# Patient Record
Sex: Female | Born: 1985 | Race: Black or African American | Hispanic: No | Marital: Single | Smoking: Never smoker
Health system: Southern US, Community
[De-identification: ages and names within clinical notes are randomized; demographics above are authoritative.]

## PROBLEM LIST (undated history)

## (undated) DIAGNOSIS — M545 Low back pain, unspecified: Secondary | ICD-10-CM

## (undated) DIAGNOSIS — E559 Vitamin D deficiency, unspecified: Secondary | ICD-10-CM

## (undated) DIAGNOSIS — R5383 Other fatigue: Secondary | ICD-10-CM

## (undated) DIAGNOSIS — R600 Localized edema: Secondary | ICD-10-CM

## (undated) DIAGNOSIS — F419 Anxiety disorder, unspecified: Secondary | ICD-10-CM

## (undated) DIAGNOSIS — A749 Chlamydial infection, unspecified: Secondary | ICD-10-CM

## (undated) DIAGNOSIS — E739 Lactose intolerance, unspecified: Secondary | ICD-10-CM

## (undated) DIAGNOSIS — K5900 Constipation, unspecified: Secondary | ICD-10-CM

## (undated) DIAGNOSIS — M2240 Chondromalacia patellae, unspecified knee: Secondary | ICD-10-CM

## (undated) DIAGNOSIS — R0602 Shortness of breath: Secondary | ICD-10-CM

## (undated) HISTORY — DX: Other fatigue: R53.83

## (undated) HISTORY — DX: Constipation, unspecified: K59.00

## (undated) HISTORY — DX: Vitamin D deficiency, unspecified: E55.9

## (undated) HISTORY — DX: Lactose intolerance, unspecified: E73.9

## (undated) HISTORY — DX: Localized edema: R60.0

## (undated) HISTORY — DX: Chondromalacia patellae, unspecified knee: M22.40

## (undated) HISTORY — DX: Chlamydial infection, unspecified: A74.9

## (undated) HISTORY — DX: Shortness of breath: R06.02

## (undated) HISTORY — DX: Anxiety disorder, unspecified: F41.9

## (undated) HISTORY — DX: Low back pain, unspecified: M54.50

---

## 2003-09-27 ENCOUNTER — Emergency Department (HOSPITAL_COMMUNITY): Admission: EM | Admit: 2003-09-27 | Discharge: 2003-09-27 | Payer: Self-pay | Admitting: Emergency Medicine

## 2006-08-02 DIAGNOSIS — A749 Chlamydial infection, unspecified: Secondary | ICD-10-CM

## 2006-08-02 HISTORY — DX: Chlamydial infection, unspecified: A74.9

## 2011-09-28 ENCOUNTER — Ambulatory Visit
Admission: RE | Admit: 2011-09-28 | Discharge: 2011-09-28 | Disposition: A | Discharge status: Home / Self Care | Payer: BC Managed Care – PPO | Source: Ambulatory Visit | Attending: Family Medicine | Admitting: Family Medicine

## 2011-09-28 ENCOUNTER — Other Ambulatory Visit: Payer: Self-pay | Admitting: Family Medicine

## 2011-09-28 ENCOUNTER — Encounter (INDEPENDENT_AMBULATORY_CARE_PROVIDER_SITE_OTHER): Payer: Self-pay | Admitting: Surgery

## 2011-09-28 DIAGNOSIS — R1030 Lower abdominal pain, unspecified: Secondary | ICD-10-CM

## 2011-09-28 DIAGNOSIS — N209 Urinary calculus, unspecified: Secondary | ICD-10-CM

## 2011-09-28 MED ORDER — IOHEXOL 300 MG/ML  SOLN
30.0000 mL | Freq: Once | INTRAMUSCULAR | Status: AC | PRN
  Administered 2011-09-28: 30 mL via ORAL

## 2011-09-28 MED ORDER — IOHEXOL 300 MG/ML  SOLN
100.0000 mL | Freq: Once | INTRAMUSCULAR | Status: AC | PRN
  Administered 2011-09-28: 100 mL via INTRAVENOUS

## 2011-09-29 ENCOUNTER — Encounter (INDEPENDENT_AMBULATORY_CARE_PROVIDER_SITE_OTHER): Payer: Self-pay | Admitting: General Surgery

## 2011-09-29 ENCOUNTER — Ambulatory Visit (INDEPENDENT_AMBULATORY_CARE_PROVIDER_SITE_OTHER): Payer: BC Managed Care – PPO | Admitting: General Surgery

## 2011-09-29 DIAGNOSIS — R1031 Right lower quadrant pain: Secondary | ICD-10-CM

## 2011-09-29 NOTE — Progress Notes (Signed)
Patient ID: Regina Bowers, female   DOB: 01-30-1986, 26 y.o.   MRN: [id number]  No chief complaint on file.   Regina Bowers is a 26 y.o. female.  This patient is referred by Dr. Paulino Rily for evaluation of right lower quadrant abdominal pain. She states that the pain began about 3 days ago in her right lower quadrant and side after working out. She had some crampy pain and thought that this was discussed pain. She took a laxative on Sunday for relief but her symptoms were not relieved. She did have a bowel movement today which was normal. On Monday her symptoms were worse after eating chili and after moving around but today she denies any pain. She has no other associated symptoms such as dysuria or hematuria she has no anorexia, fevers, chills, nausea, or vomiting. Her last menstrual period was February 14th. She had a CT scan performed yesterday which demonstrated an abnormal appendix with some thickening of the appendix and some dilation but there were no periappendiceal inflammatory changes her white count was normal at 5.6 with a 73% neutrophil count otherwise her labs were unremarkable UA was normal. Pregnancy test was negative. Regina  Past Medical History  Diagnosis Date  . Chlamydia 2008  . Abdominal pain     History reviewed. No pertinent past surgical history.  History reviewed. No pertinent family history.  Social History History  Substance Use Topics  . Smoking status: Never Smoker   . Smokeless tobacco: Not on file  . Alcohol Use: Yes     1x month    Allergies  Allergen Reactions  . Aspirin Hives  . Ibuprofen Hives    Current Outpatient Prescriptions  Medication Sig Dispense Refill  . Echinacea 400 MG CAPS Take 400 mg by mouth daily.      . folic acid (FOLVITE) 1 MG tablet Take 1 mg by mouth daily.      . nitroGLYCERIN (NITROGLYN) 2 % ointment Place 0.5 inches onto the skin every 6 (six) hours.      . Norgestimate-Ethinyl Estradiol Triphasic (TRI-SPRINTEC)  0.18/0.215/0.25 MG-35 MCG tablet Take 1 tablet by mouth daily.      . pediatric multivitamin-fluoride (POLY-VI-FLOR) 0.25 MG chewable tablet Chew 1 tablet by mouth daily.      Marland Kitchen sulfamethoxazole-trimethoprim (BACTRIM,SEPTRA) 400-80 MG per tablet Take 1 tablet by mouth 2 (two) times daily.       No current facility-administered medications for this visit.   Facility-Administered Medications Ordered in Other Visits  Medication Dose Route Frequency Provider Last Rate Last Dose  . iohexol (OMNIPAQUE) 300 MG/ML solution 30 mL  30 mL Oral Once PRN Medication Radiologist, MD   30 mL at 09/28/11 1206    Review of Systems Review of Systems All other review of systems negative or noncontributory except as stated in the Regina  Last menstrual period 09/16/2011.  Physical Exam Physical Exam Physical Exam  Nursing note and vitals reviewed. Constitutional: She is oriented to person, place, and time. She appears well-developed and well-nourished. No distress.  HENT:  Head: Normocephalic and atraumatic.  Mouth/Throat: No oropharyngeal exudate.  Eyes: Conjunctivae and EOM are normal. Pupils are equal, round, and reactive to light. Right eye exhibits no discharge. Left eye exhibits no discharge. No scleral icterus.  Neck: Normal range of motion. Neck supple. No tracheal deviation present.  Cardiovascular: Normal rate, regular rhythm, normal heart sounds and intact distal pulses.   Pulmonary/Chest: Effort normal and breath sounds normal. No stridor. No respiratory distress.  She has no wheezes.  Abdominal: Soft. Bowel sounds are normal. She exhibits no distension and no mass. There is mild RLQ tenderness. There is no rebound and no guarding.  Musculoskeletal: Normal range of motion. She exhibits no edema and no tenderness.  Neurological: She is alert and oriented to person, place, and time.  Skin: Skin is warm and dry. No rash noted. She is not diaphoretic. No erythema. No pallor.  Psychiatric: She has a  normal mood and affect. Her behavior is normal. Judgment and thought content normal.      Data Reviewed CT, labs  Assessment    Right lower quadrant abdominal pain This does not appear to be acute appendicitis given her history. She is actually pain-free today and I suspect that her symptoms would only be progressive if this was indeed acute appendicitis. However, given her abnormal appendix the question is what to do about this for a long-term standpoint. I offered to see her back in one month and repeat the CT scan at that time or diagnostic laparoscopy and appendectomy to remove the abnormal appendix. This may be acute appendicitis and I explained that I cannot completely rule this out without surgical intervention but given her symptoms again, I think that this is less likely. This may be appendiceal thickening from prior ruptured ovarian cyst or other intra-abdominal cause but regardless I think we should at least at the minimum repeat the imaging within a few weeks.I discussed the procedure involved and the risks of the procedure including infection, bleeding, pain, scarring, bowel injury, staple line leaks, and abscess.    Plan    She has discussed this with her mother and since she is currently pain-free she would like to hold off on any surgery. We will plan for repeat CT scan in 4 weeks and reevaluate at that time she will likely need appendectomy on elective basis. She understands that if her symptoms continue or if she has any fevers or chills or increasing pain that she should present to the emergency room for immediate appendectomy.       Lodema Pilot DAVID 09/29/2011, 11:42 AM

## 2011-10-25 ENCOUNTER — Ambulatory Visit
Admission: RE | Admit: 2011-10-25 | Discharge: 2011-10-25 | Disposition: A | Discharge status: Home / Self Care | Payer: BC Managed Care – PPO | Source: Ambulatory Visit | Attending: General Surgery | Admitting: General Surgery

## 2011-10-25 DIAGNOSIS — R1031 Right lower quadrant pain: Secondary | ICD-10-CM

## 2011-10-25 MED ORDER — IOHEXOL 300 MG/ML  SOLN
100.0000 mL | Freq: Once | INTRAMUSCULAR | Status: AC | PRN
  Administered 2011-10-25: 100 mL via INTRAVENOUS

## 2012-05-01 ENCOUNTER — Telehealth: Payer: Self-pay | Admitting: Obstetrics and Gynecology

## 2012-05-01 MED ORDER — NORGESTIM-ETH ESTRAD TRIPHASIC 0.18/0.215/0.25 MG-35 MCG PO TABS: 1.0000 | ORAL_TABLET | Freq: Every day | ORAL | Status: DC

## 2012-05-01 NOTE — Telephone Encounter (Signed)
Spoke with pt informed rx sent to pharm pt voice understanding 

## 2012-05-01 NOTE — Telephone Encounter (Signed)
VM from pt. REquesting RF Trisrpintec. [place] 11/14.   [facility], [address].  Pt [phone]

## 2012-06-14 ENCOUNTER — Ambulatory Visit (INDEPENDENT_AMBULATORY_CARE_PROVIDER_SITE_OTHER): Payer: BC Managed Care – PPO | Admitting: Obstetrics and Gynecology

## 2012-06-14 ENCOUNTER — Encounter: Payer: Self-pay | Admitting: Obstetrics and Gynecology

## 2012-06-14 VITALS — BP 122/80 | Ht 61.0 in | Wt 203.0 lb

## 2012-06-14 DIAGNOSIS — Z202 Contact with and (suspected) exposure to infections with a predominantly sexual mode of transmission: Secondary | ICD-10-CM

## 2012-06-14 DIAGNOSIS — Z2089 Contact with and (suspected) exposure to other communicable diseases: Secondary | ICD-10-CM

## 2012-06-14 DIAGNOSIS — Z124 Encounter for screening for malignant neoplasm of cervix: Secondary | ICD-10-CM

## 2012-06-14 NOTE — Patient Instructions (Signed)

## 2012-06-14 NOTE — Progress Notes (Signed)
Last Pap: 2012 WNL: Yes Regular Periods:yes Contraception: tri-sprintec  Monthly Breast exam:yes Tetanus<36yrs:yes Nl.Bladder Function:yes Daily BMs:yes Healthy Diet:yes Calcium:yes Mammogram:no Date of Mammogram: n/a Exercise:yes Have often Exercise: occ Seatbelt: yes Abuse at home: no Stressful work:no Sigmoid-colonoscopy: n/a Bone Density: No PCP: Dr.Wolters Change in PMH: no change Change in FMH:no change BP 122/80  Ht 5\' 1"  (1.549 m)  Wt 203 lb (92.08 kg)  BMI 38.36 kg/m2  LMP 05/26/2012 Pt with complaints:no Physical Examination: General appearance - alert, well appearing, and in no distress Mental status - normal mood, behavior, speech, dress, motor activity, and thought processes Neck - supple, no significant adenopathy,  thyroid exam: thyroid is normal in size without nodules or tenderness Chest - clear to auscultation, no wheezes, rales or rhonchi, symmetric air entry Heart - normal rate and regular rhythm Abdomen - soft, nontender, nondistended, no masses or organomegaly Breasts - breasts appear normal, no suspicious masses, no skin or nipple changes or axillary nodes Pelvic - normal external genitalia, vulva, vagina, cervix, uterus and adnexa Rectal - rectal exam not indicated Back exam - full range of motion, no tenderness, palpable spasm or pain on motion Neurological - alert, oriented, normal speech, no focal findings or movement disorder noted Musculoskeletal - no joint tenderness, deformity or swelling Extremities - no edema, redness or tenderness in the calves or thighs Skin - normal coloration and turgor, no rashes, no suspicious skin lesions noted Routine exam Pap sent yes Mammogram due no sprintec used for contraception RT 1 yr

## 2012-06-15 ENCOUNTER — Telehealth: Payer: Self-pay | Admitting: Obstetrics and Gynecology

## 2012-06-15 NOTE — Telephone Encounter (Signed)
nd pt 

## 2012-06-15 NOTE — Telephone Encounter (Signed)
Spoke with pt Regina Bowers msg pt states she having break threw bleeding on tri sprentec for the past 5 months pt may want to switch bc advised pt will consult with ND and call her back pt voice understanding

## 2012-06-16 LAB — PAP IG, CT-NG, RFX HPV ASCU: GC Probe Amp: NEGATIVE

## 2012-06-16 NOTE — Telephone Encounter (Signed)
Has pt missed any pills?  If not she can try ortho cyclen or ovcon 35 for the next three months

## 2012-06-19 ENCOUNTER — Telehealth: Payer: Self-pay | Admitting: Obstetrics and Gynecology

## 2012-06-19 LAB — HUMAN PAPILLOMAVIRUS, HIGH RISK: HPV DNA High Risk: DETECTED — AB

## 2012-06-19 MED ORDER — NORETHINDRONE-ETH ESTRADIOL 1-35 MG-MCG PO TABS: 1.0000 | ORAL_TABLET | Freq: Every day | ORAL | Status: DC

## 2012-06-19 NOTE — Telephone Encounter (Signed)
Spoke with pt rgd prev conversation informed pt per ND will switch bc to see if it helps with break thru bleeding advised rx sent to pharm pt voice understanding

## 2012-06-27 ENCOUNTER — Telehealth: Payer: Self-pay

## 2012-06-27 NOTE — Telephone Encounter (Signed)
Spoke with pt rgd labs informed pap showed abnl cells need colpo pt has appt 07/14/12 at 2:45 with ND pt voice understanding

## 2012-06-27 NOTE — Telephone Encounter (Signed)
Message copied by Rolla Plate on Tue Jun 27, 2012 11:18 AM ------      Message from: Jaymes Graff      Created: Sun Jun 25, 2012 10:19 PM       Please schedule pt for colposcopy.

## 2012-07-04 ENCOUNTER — Telehealth: Payer: Self-pay | Admitting: Obstetrics and Gynecology

## 2012-07-06 ENCOUNTER — Telehealth: Payer: Self-pay | Admitting: Obstetrics and Gynecology

## 2012-07-10 NOTE — Telephone Encounter (Signed)
See msg, thanks

## 2012-07-11 ENCOUNTER — Telehealth: Payer: Self-pay | Admitting: Obstetrics and Gynecology

## 2012-07-11 NOTE — Telephone Encounter (Signed)
TC TO PT TO RESCHEDULE COLPO. PT DECIDED TO KEEP THE APPT SHE HAS WHICH IS 07-14-12 AT 2:45 PM.

## 2012-07-14 ENCOUNTER — Encounter: Payer: BC Managed Care – PPO | Admitting: Obstetrics and Gynecology

## 2012-07-25 NOTE — Telephone Encounter (Signed)
CALL PT TO RESCHEDULE PT COLPO. SCHEDULE PT COLPO WITH ND TO 08-01-12. PT VOICED UNDERSTANDING.

## 2012-08-01 ENCOUNTER — Encounter: Payer: Self-pay | Admitting: Obstetrics and Gynecology

## 2012-08-01 ENCOUNTER — Ambulatory Visit (INDEPENDENT_AMBULATORY_CARE_PROVIDER_SITE_OTHER): Payer: BC Managed Care – PPO | Admitting: Obstetrics and Gynecology

## 2012-08-01 VITALS — BP 100/68 | Wt 211.0 lb

## 2012-08-01 DIAGNOSIS — R6889 Other general symptoms and signs: Secondary | ICD-10-CM

## 2012-08-01 NOTE — Addendum Note (Signed)
Addended by: Rolla Plate on: 08/01/2012 04:27 PM   Modules accepted: Orders

## 2012-08-01 NOTE — Patient Instructions (Signed)

## 2012-08-01 NOTE — Progress Notes (Signed)
Previous Pap Smear: 06/04/12 ASCUS pos HRHPV Previous Colposcopy: 07/21/09 LSIL, MILD DYSPLASIA, HPV INFECTION, CIN1 7 o'clock  Referred From: n/a LMP: 07/17/12 Contraception: pill- Necon G,P: 0, 0 BP 100/68  Wt 211 lb (95.709 kg)  LMP 07/17/2012 AW changes and mosacism at 2 o clock.   bx st 2 with ECC colpo adequate RT 6 monts for pap

## 2012-08-08 ENCOUNTER — Telehealth: Payer: Self-pay

## 2012-08-08 NOTE — Telephone Encounter (Signed)
Spoke with pt rgd schd appt to discuss colpo results pt has appt 08/12/11 at 3:15 with nd pt voice understanding

## 2012-08-08 NOTE — Telephone Encounter (Signed)
Message copied by Rolla Plate on Tue Aug 08, 2012 11:33 AM ------      Message from: Jaymes Graff      Created: Mon Aug 07, 2012  9:24 PM       Please have pt come in to discuss results

## 2012-08-11 ENCOUNTER — Encounter: Payer: Self-pay | Admitting: Obstetrics and Gynecology

## 2012-08-11 ENCOUNTER — Ambulatory Visit (INDEPENDENT_AMBULATORY_CARE_PROVIDER_SITE_OTHER): Payer: BC Managed Care – PPO | Admitting: Obstetrics and Gynecology

## 2012-08-11 VITALS — BP 110/64 | Wt 218.0 lb

## 2012-08-11 DIAGNOSIS — N871 Moderate cervical dysplasia: Secondary | ICD-10-CM

## 2012-08-11 NOTE — Patient Instructions (Signed)
Cervical Dysplasia Cervical dysplasia is a condition in which a woman has abnormal changes in the cells of her cervix. The cervix is the opening to the uterus (womb) between the vagina and the uterus. These changes are called cervical dysplasia and may be the first signs of cervical cancer. These cells can be taken from the cervix during a Pap test and then looked at under a microscope. With early detection, treatment, and close follow-up care, nearly all cervical dysplasia can be cured. If untreated, the mild to moderate stages of dysplasia often grow more severe.  RISK FACTORS  The following increase the risk for cervical dysplasia.  Having had a sexually transmitted disease, including:  Chlamydia.  Human papilloma virus (HPV).  Becoming sexually active before age 18.  Having had more than 1 sexual partner.  Not using protection, such as condoms, during sexual intercourse, especially with new sexual partners.  Having had cancer of the vagina or vulva.  Having a sexual partner whose previous partner had cancer of the cervix or cervical dysplasia.  Having a sexual partner who has or has had cancer of the penis.  Having a weakened immune system (HIV, organ transplant).  Being the daughter of a woman who took DES (diethylstilbestrol) during pregnancy.  A history of cervical cancer in a woman's sister or mother.  Smoking.  Having had an abnormal Pap test in the past. SYMPTOMS  There are usually no symptoms. If there are symptoms, they may be vague such as:  Abnormal vaginal discharge.  Bleeding between periods or following intercourse.  Bleeding during menopause.  Pain on intercourse (dyspareunia). DIAGNOSIS   The Pap test is the best way of detecting abnormalities of the cervix.  Biopsy (removing a piece of tissue to look at under the microscope) of the cervix when the Pap test is abnormal or when the Pap test is normal, but the cervix looks abnormal. TREATMENT    Catching and treating the changes early with Pap tests can prevent cervical cancer.  Cryotherapy freezes the abnormal cells with a steel tip instrument.  A laser can be used to remove the abnormal cells.  Loop electrocautery excision procedure (LEEP). This procedure uses a heated electrical loop to remove a cone-like portion of the cervix, including the cervical canal.  For more serious cases of cervical dysplasia, the abnormal tissue may be removed surgically by:  A cone biopsy (by cold knife, laser or LEEP). A procedure in which a portion of the center of the cervix with the cervical canal is removed.  The uterus and cervix are removed (hysterectomy). Your caregiver will advise you regarding the need and timing of Pap tests in your follow-up. Women who have been treated for dysplasia should be closely followed with pelvic exams and Pap tests. During the first year following treatment of cervical dysplasia, Pap tests should be done every 3 to 4 months. In the second year, the schedule is every 6 months, or as recommended by your caregiver. See your caregiver for new or worsening problems. HOME CARE INSTRUCTIONS   Follow the instructions and recommendations of your caregiver regarding medicines and follow-up appointments.  Only take over-the-counter or prescription medicines for pain or discomfort as directed by your caregiver.  Cramping and pelvic discomfort may follow cryotherapy. It is not abnormal to have watery discharge for several weeks after.  Laser, cone surgery, cryotherapy or LEEP can cause a bad smelling vaginal discharge. It may also cause vaginal bleeding for a couple weeks following the procedure. The   discharge may be black from the paste used to control bleeding from the cone site. This is normal.  Do not use tampons, have sexual intercourse or douche until your caregiver says it is okay. SEEK MEDICAL CARE IF:   You develop genital warts.  You need a prescription for  pain medicine following your treatment. SEEK IMMEDIATE MEDICAL CARE IF:   Your bleeding is heavier than a normal menstrual period.  You develop bright red bleeding, especially if you have blood clots.  You have a fever.  You have increasing cramps or pain not relieved with medicine.  You are lightheaded, unusually weak, or have fainting spells.  You have abnormal vaginal discharge.  You develop abdominal pain. PREVENTION   The surest way to prevent cervical dysplasia is to abstain from sexual intercourse.  Practice safe sex, use condoms and have only one sex partner who does not have other sex partners.  A Pap test is done to screen for cervical cancer.  The first Pap test should be done at age 21.  Between ages 21 and 29, Pap tests are repeated every 2 years.  Beginning at age 30, you are advised to have a Pap test every 3 years as long as your past 3 Pap tests have been normal.  Some women have medical problems that increase the chance of getting cervical cancer. Talk to your caregiver about these problems. It is especially important to talk to your caregiver if a new problem develops soon after your last Pap test. In these cases, your caregiver may recommend more frequent screening and Pap tests.  The above recommendations are the same for women who have or have not gotten the vaccine for HPV (Human Papillomavirus).  If you had a hysterectomy for a problem that was not a cancer or a condition that could lead to cancer, then you no longer need Pap tests. However, even if you no longer need a Pap test, a regular exam is a good idea to make sure no other problems are starting.   If you are between ages 65 and 70, and you have had normal Pap tests going back 10 years, you no longer need Pap tests. However, even if you no longer need a Pap test, a regular exam is a good idea to make sure no other problems are starting.   If you have had past treatment for cervical cancer or a  condition that could lead to cancer, you need Pap tests and screening for cancer for at least 20 years after your treatment.  If Pap tests have been discontinued, risk factors (such as a new sexual partner) need to be re-assessed to determine if screening should be resumed.  Some women may need screenings more often if they are at high risk for cervical cancer.  Your caregiver may do additional tests including:  Colposcopy. A procedure in which a special microscope magnifies the cells and allows the provider to closely examine the cervix, vagina, and vulva.  Biopsy. A small tissue sample is taken from the cervix, vagina or vulva. This is generally done in your caregivers office.  A cone biopsy (cold knife or laser). A large tissue sample is taken from the cervix. This procedure is usually done in an operating room under a general anesthetic. The cone often removes all abnormal tissue and so may also complete the treatment.  LEEP, also removing a circular portion of the cervix and is done in a doctors office under a local anesthetic.  Now   there is a vaccine, Gardasil, that was developed to prevent the HPV'S that can cause cancer of the cervix and genital warts. It is recommended for females ages 9 to 26. It should not be given to pregnant women until more is known about its effects on the fetus. Not all cancers of the cervix are caused by the HPV. Routine gynecology exams and Pap tests should continue as recommended by your caregiver. Document Released: 07/19/2005 Document Revised: 10/11/2011 Document Reviewed: 07/10/2008 Exit[employer] Patient Information 2013 [employer].  

## 2012-08-11 NOTE — Progress Notes (Signed)
Discuss colpo results.  Pt without c/o  Report Comments: FINAL DIAGNOSIS: A. Cervix- Biopsy, 2 o'clock:  High grade squamous intraepithelial lesion (HSIL), moderate dysplasia, CIN II.  See comment.  B. Endocervix - Curettage:  Fragments of benign endocervical mucosa.  Negative for atypia.  Pathophysiology discussed with the pt Pt told the difference between LEEP, CRYO, and OBS She will call in a week with her decision

## 2012-09-07 ENCOUNTER — Telehealth: Payer: Self-pay | Admitting: Obstetrics and Gynecology

## 2012-09-07 NOTE — Telephone Encounter (Signed)
Tc to pt rgd msg. Pt states she forgot her question and will call back when she remembers.

## 2012-10-07 IMAGING — CT CT ABD-PELV W/ CM
2 of 4 series · 17 of 46 positions shown, 19 images · IV contrast (agent unspecified)
Comparison: 09/28/2011.

CLINICAL DATA: Follow-up appendiceal inflammatory process.

CT ABDOMEN AND PELVIS WITH CONTRAST
TECHNIQUE: Multidetector CT imaging of the abdomen and pelvis was
performed following the standard protocol during bolus
administration of intravenous contrast.
Contrast:  100 ml Mmnipaque-EJJ.

[Series 2: abd/pelvis with · axial · 0.70mm/px · z∈[-344,+6]mm · 14 of 77 slices shown, 16 images]
[im 4/77  soft-tissue]
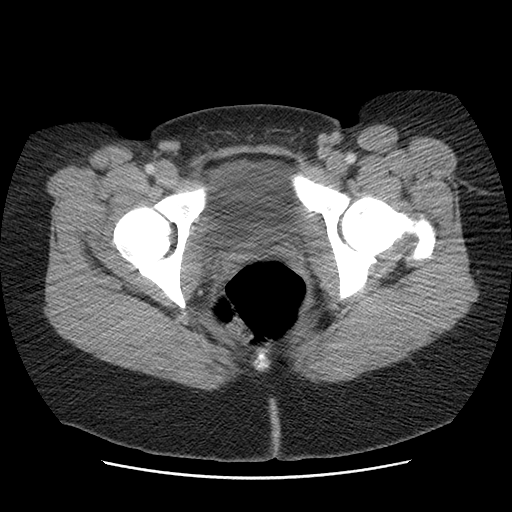
[im 4/77  bone]
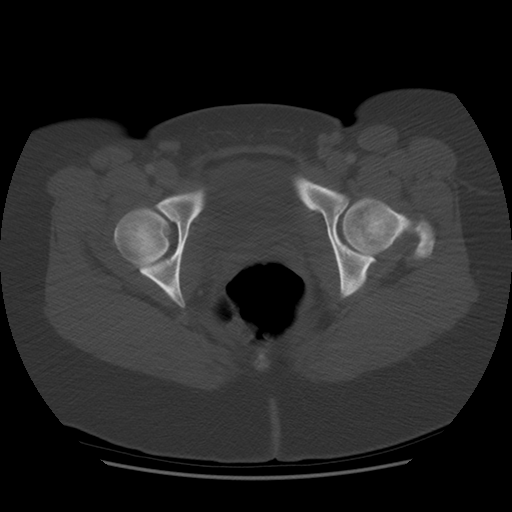
[im 10/77  soft-tissue]
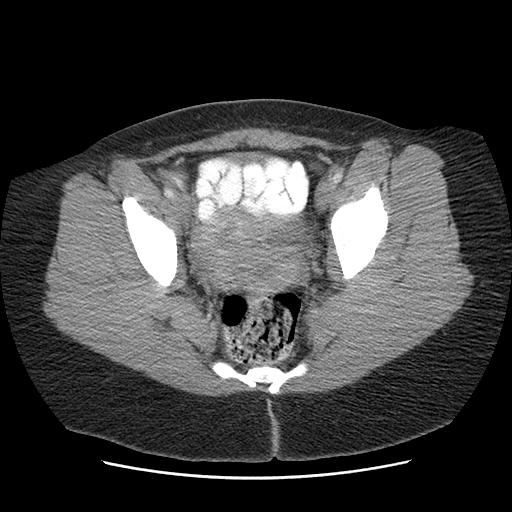
[im 14/77  soft-tissue]
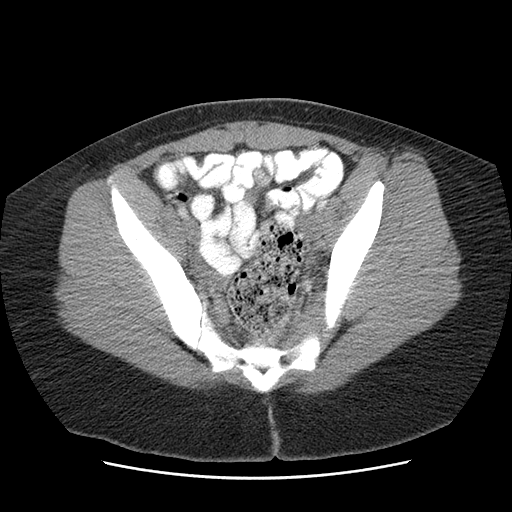
[im 20/77  soft-tissue]
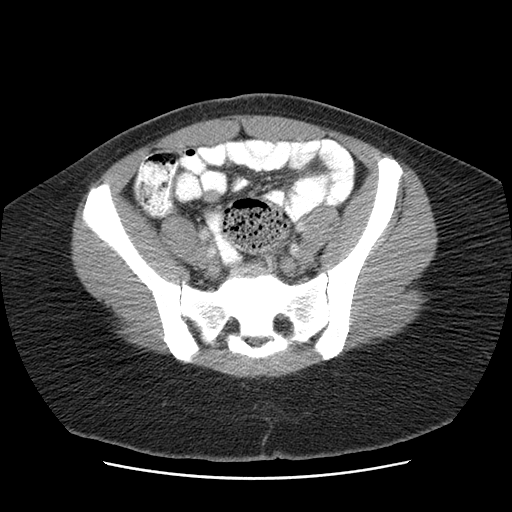
[im 27/77  soft-tissue]
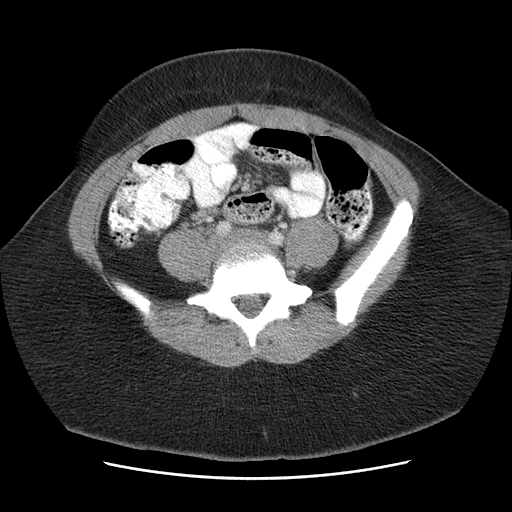
[im 30/77  soft-tissue]
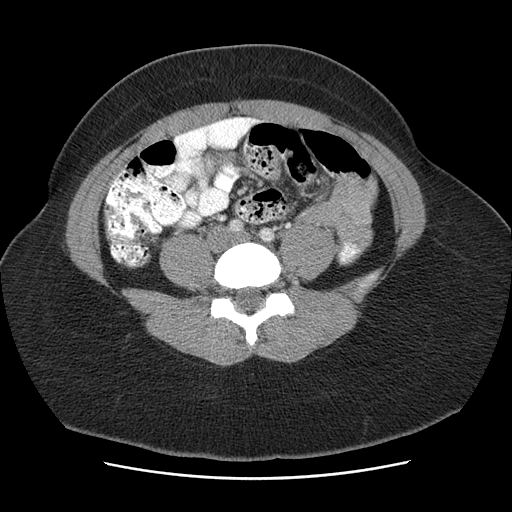
[im 37/77  soft-tissue]
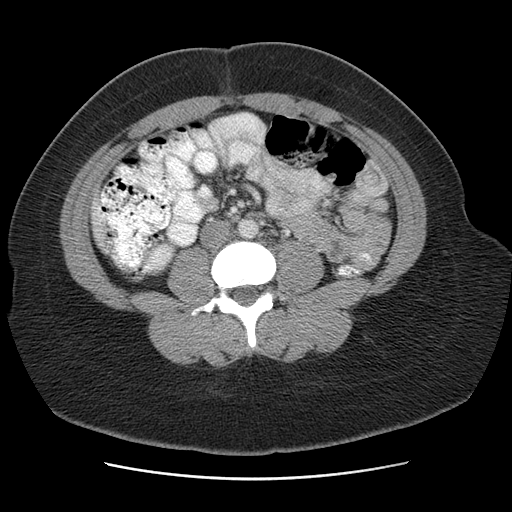
[im 40/77  soft-tissue]
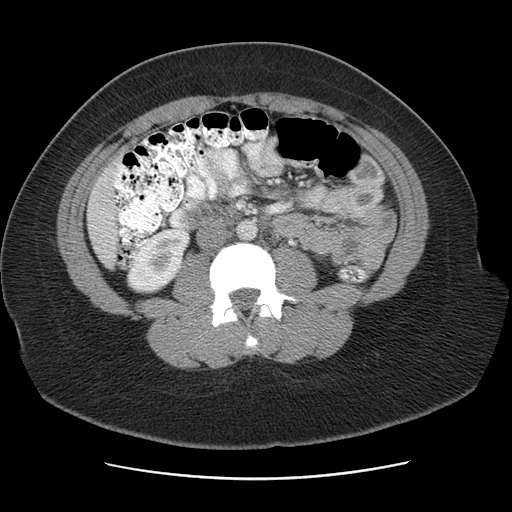
[im 47/77  soft-tissue]
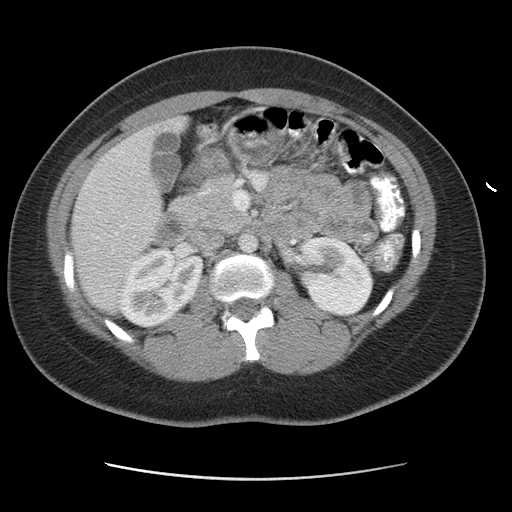
[im 47/77  bone]
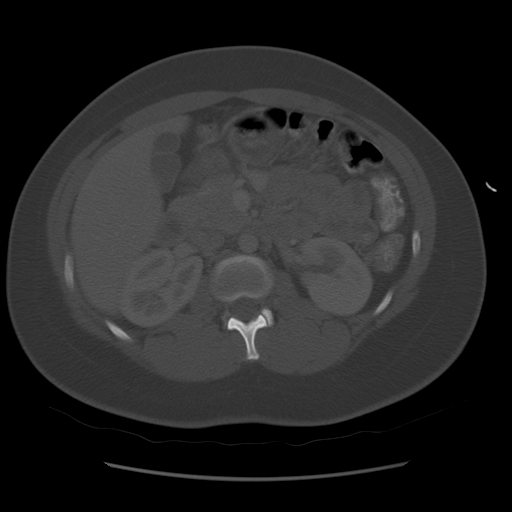
[im 50/77  soft-tissue]
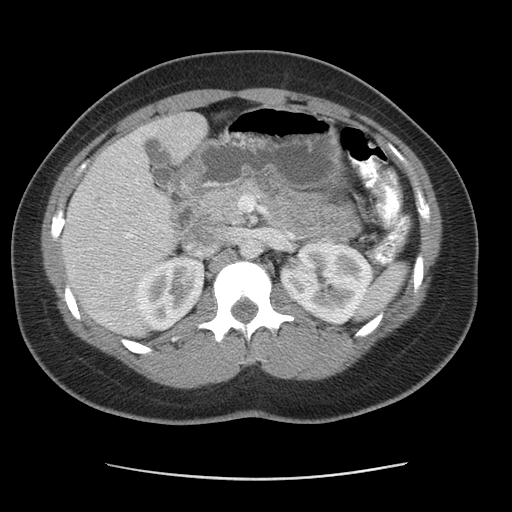
[im 57/77  soft-tissue]
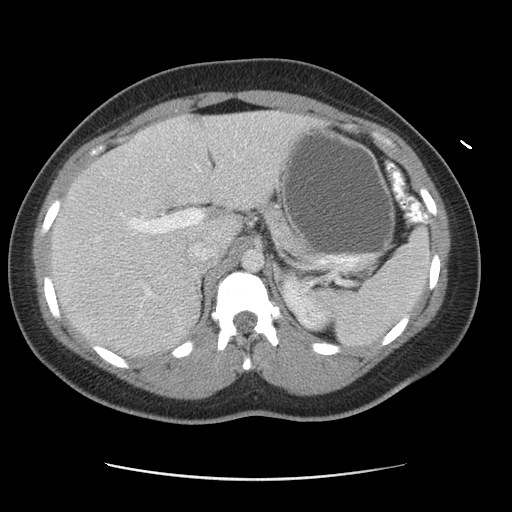
[im 63/77  soft-tissue]
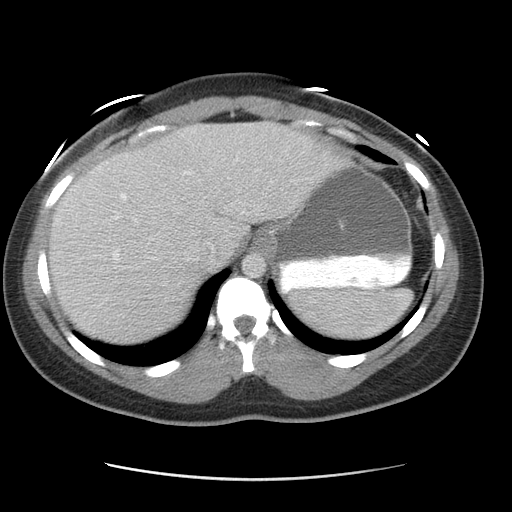
[im 67/77  soft-tissue]
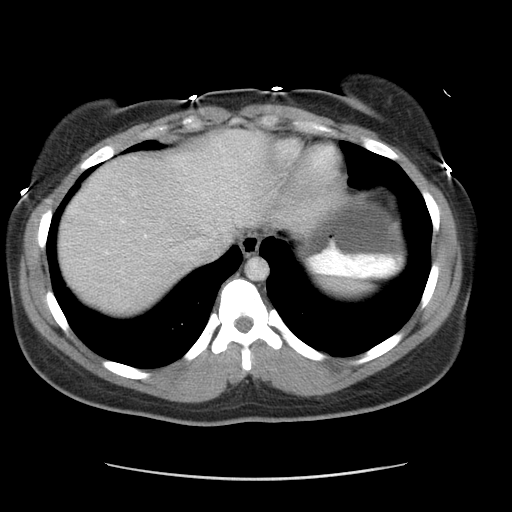
[im 73/77  soft-tissue]
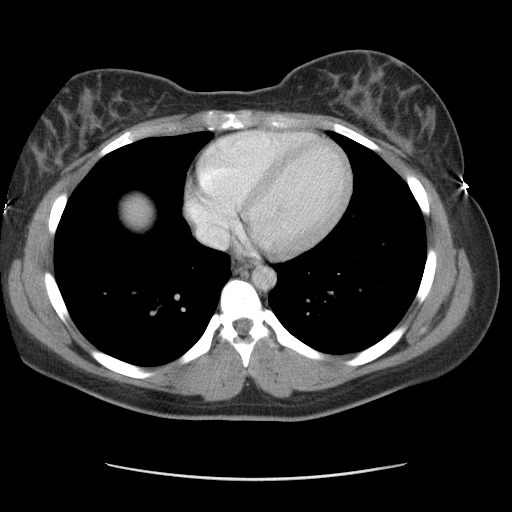

[Series 400: cor · coronal · 0.91mm/px · 3 of 125 slices shown]
[im 42/125  soft-tissue]
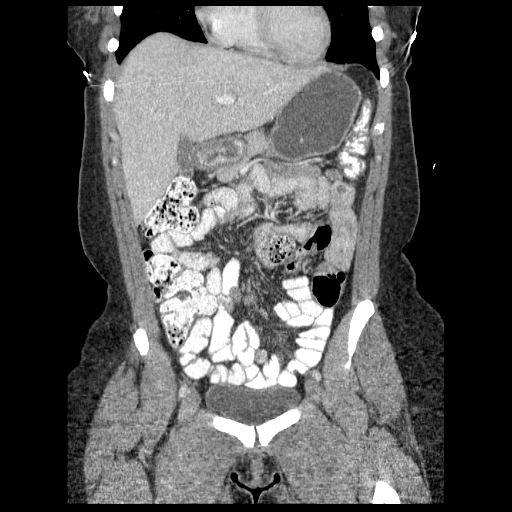
[im 56/125  soft-tissue]
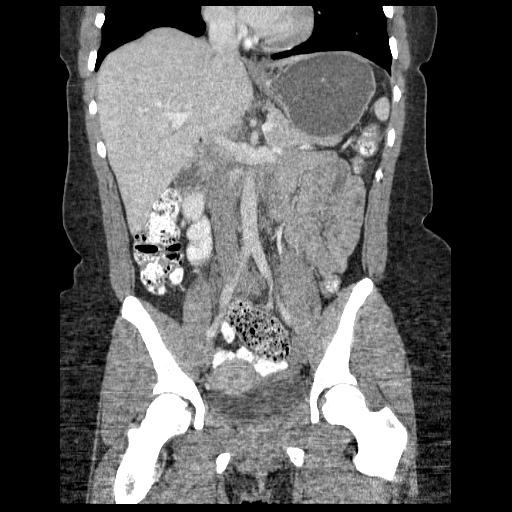
[im 69/125  soft-tissue]
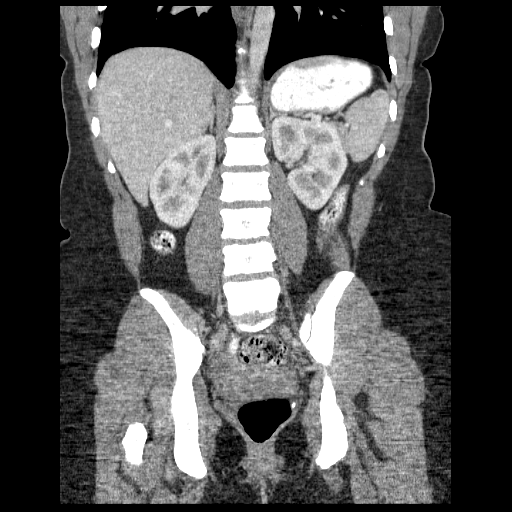

[17 of 46 positions shown; findings below may reference images not displayed]

FINDINGS: Lung bases show no acute findings.  Heart size normal.
No pericardial or pleural effusion.

Liver, gallbladder, adrenal glands, kidneys, spleen, pancreas,
stomach and bowel are unremarkable.  Appendix is air-filled and
normal in appearance (coronal image 50).  No inflammatory changes
in the right lower quadrant.  No abnormal fluid collections.  No
free fluid.  Inguinal lymph nodes are likely reactive.  Uterus and
ovaries are visualized.  No pathologically enlarged lymph nodes.
No worrisome lytic or sclerotic lesions.
IMPRESSION: Appendix is unremarkable.  Interval resolution of previously
suspected inflammatory findings discussed on 09/28/2011.

## 2012-10-11 ENCOUNTER — Telehealth: Payer: Self-pay | Admitting: Obstetrics and Gynecology

## 2012-10-11 NOTE — Telephone Encounter (Signed)
TC call regarding pt call. Per last note 08/11/2012 pt needed to call back w/decision for treatment Of abnormal pap/colpo.  Pt has decided she wants Cryosurgery.  Pt made aware I will make Dr.Dillard /NG,CMA aware.  Pt voiced her understanding.  [facility] CMA

## 2021-08-13 HISTORY — PX: DENTAL SURGERY: SHX609

## 2021-09-10 DIAGNOSIS — Z0289 Encounter for other administrative examinations: Secondary | ICD-10-CM

## 2021-09-22 ENCOUNTER — Encounter (INDEPENDENT_AMBULATORY_CARE_PROVIDER_SITE_OTHER): Payer: Self-pay | Admitting: Bariatrics

## 2021-09-22 ENCOUNTER — Other Ambulatory Visit: Payer: Self-pay

## 2021-09-22 ENCOUNTER — Ambulatory Visit (INDEPENDENT_AMBULATORY_CARE_PROVIDER_SITE_OTHER): Payer: BC Managed Care – PPO | Admitting: Bariatrics

## 2021-09-22 VITALS — BP 106/72 | HR 81 | Temp 98.2°F | Ht 63.0 in | Wt 264.0 lb

## 2021-09-22 DIAGNOSIS — E559 Vitamin D deficiency, unspecified: Secondary | ICD-10-CM | POA: Diagnosis not present

## 2021-09-22 DIAGNOSIS — K5901 Slow transit constipation: Secondary | ICD-10-CM

## 2021-09-22 DIAGNOSIS — Z1331 Encounter for screening for depression: Secondary | ICD-10-CM

## 2021-09-22 DIAGNOSIS — E669 Obesity, unspecified: Secondary | ICD-10-CM

## 2021-09-22 DIAGNOSIS — R5383 Other fatigue: Secondary | ICD-10-CM

## 2021-09-22 DIAGNOSIS — E739 Lactose intolerance, unspecified: Secondary | ICD-10-CM | POA: Diagnosis not present

## 2021-09-22 DIAGNOSIS — R0602 Shortness of breath: Secondary | ICD-10-CM | POA: Diagnosis not present

## 2021-09-22 DIAGNOSIS — Z6841 Body Mass Index (BMI) 40.0 and over, adult: Secondary | ICD-10-CM

## 2021-09-22 DIAGNOSIS — M545 Low back pain, unspecified: Secondary | ICD-10-CM

## 2021-09-22 DIAGNOSIS — R7309 Other abnormal glucose: Secondary | ICD-10-CM

## 2021-09-22 NOTE — Progress Notes (Signed)
Chief Complaint:   OBESITY Regina Bowers (MR# [id number]) is a 36 y.o. female who presents for evaluation and treatment of obesity and related comorbidities. Current BMI is Body mass index is 46.77 kg/m. Regina Bowers has been struggling with her weight for many years and has been unsuccessful in either losing weight, maintaining weight loss, or reaching her healthy weight goal.  Regina Bowers states that she likes to cook, but she notes this as an obstacle. She travels a lot and she needs to know how to best eat out.  Regina Bowers is currently in the action stage of change and ready to dedicate time achieving and maintaining a healthier weight. Regina Bowers is interested in becoming our patient and working on intensive lifestyle modifications including (but not limited to) diet and exercise for weight loss.  Regina Bowers's habits were reviewed today and are as follows: her desired weight loss is 64-79 lbs, she has been heavy most of her life, she started gaining weight in 2022, her heaviest weight ever was 265 pounds, she has significant food cravings issues, she skips meals frequently, she is frequently drinking liquids with calories, she frequently makes poor food choices, she frequently eats larger portions than normal, and she struggles with emotional eating.  Depression Screen Regina Bowers's Food and Mood (modified PHQ-9) score was 7.  Depression screen [facility] 2/9 09/22/2021  Decreased Interest 2  Down, Depressed, Hopeless 2  PHQ - 2 Score 4  Altered sleeping 0  Tired, decreased energy 0  Change in appetite 2  Feeling bad or failure about yourself  1  Trouble concentrating 0  Moving slowly or fidgety/restless 0  Suicidal thoughts 0  PHQ-9 Score 7  Difficult doing work/chores Not difficult at all   Subjective:   1. Other fatigue Regina Bowers admits to daytime somnolence and denies waking up still tired. Patient has a history of symptoms of daytime fatigue. Regina Bowers generally gets 5 or 6 hours of sleep per night, and  states that she has generally restful sleep. Snoring is not present. Apneic episodes are not present. Epworth Sleepiness Score is 4.   2. Shortness of breath on exertion Regina Bowers notes increasing shortness of breath with exercising and seems to be worsening over time with weight gain. She notes getting out of breath sooner with activity than she used to. This has not gotten worse recently. Regina Bowers denies shortness of breath at rest or orthopnea.  3. Bilateral low back pain, unspecified chronicity, unspecified whether sciatica present Regina Bowers notes her back pain is worse with weight gain.  4. Lactose intolerance Regina Bowers has ha diagnosis for about 15 years.  5. Vitamin D deficiency Regina Bowers is taking prenatal vitamins.  6. Slow transit constipation Regina Bowers is not eating enough fiber.  7. Elevated glucose Regina Bowers has no family history of diabetes mellitus.  Assessment/Plan:   1. Other fatigue Regina Bowers does feel that her weight is causing her energy to be lower than it should be. Fatigue may be related to obesity, depression or many other causes. Labs will be ordered, and in the meanwhile, Regina Bowers will focus on self care including making healthy food choices, increasing physical activity and focusing on stress reduction.  - EKG 12-Lead - TSH+T4F+T3Free - Lipid Panel With LDL/HDL Ratio - Comprehensive metabolic panel  2. Shortness of breath on exertion Regina Bowers does feel that she gets out of breath more easily that she used to when she exercises. Regina Bowers's shortness of breath appears to be obesity related and exercise induced. She has agreed to work on  weight loss and gradually increase exercise to treat her exercise induced shortness of breath. Will continue to monitor closely.  3. Bilateral low back pain, unspecified chronicity, unspecified whether sciatica present Regina Bowers will work on her weight loss efforts.  4. Lactose intolerance We will discuss lactose free foods.   5. Vitamin D  deficiency We will check labs today. Regina Bowers will continue her current vitamins. She will follow-up for routine testing of Vitamin D, at least 2-3 times per year to avoid over-replacement.  - VITAMIN D 25 Hydroxy (Vit-D Deficiency, Fractures)  6. Slow transit constipation Regina Bowers will increase her water and fiber intake, and use miralax. She was informed that a decrease in bowel movement frequency is normal while losing weight, but stools should not be hard or painful. Orders and follow up as documented in patient record.   Counseling Getting to Good Bowel Health: Your goal is to have one soft bowel movement each day. Drink at least 8 glasses of water each day. Eat plenty of fiber (goal is over 25 grams each day). It is best to get most of your fiber from dietary sources which includes leafy green vegetables, fresh fruit, and whole grains. You may need to add fiber with the help of OTC fiber supplements. These include Metamucil, Citrucel, and Flaxseed. If you are still having trouble, try adding Miralax or Magnesium Citrate. If all of these changes do not work, Dietitian.  7. Elevated glucose We will check labs today, and will follow up at Regina Bowers's next visit.  - Insulin, random - Hemoglobin A1c  8. Screening for depression Regina Bowers had a positive depression screening. Depression is commonly associated with obesity and often results in emotional eating behaviors. We will monitor this closely and work on CBT to help improve the non-hunger eating patterns. Referral to Psychology may be required if no improvement is seen as she continues in our clinic.  9. Obesity with current BMI of 46.9 Regina Bowers is currently in the action stage of change and her goal is to continue with weight loss efforts. I recommend Regina Bowers begin the structured treatment plan as follows:  She has agreed to the Category 3 Plan.  Meal planning. Intentional eating. Will work on portion size.   Exercise goals: As is.    Behavioral modification strategies: increasing lean protein intake, decreasing simple carbohydrates, increasing vegetables, increasing water intake, decreasing eating out, no skipping meals, meal planning and cooking strategies, keeping healthy foods in the home, and planning for success.  She was informed of the importance of frequent follow-up visits to maximize her success with intensive lifestyle modifications for her multiple health conditions. She was informed we would discuss her lab results at her next visit unless there is a critical issue that needs to be addressed sooner. Regina Bowers agreed to keep her next visit at the agreed upon time to discuss these results.  Objective:   Blood pressure 106/72, pulse 81, temperature 98.2 F (36.8 C), height 5\' 3"  (1.6 m), weight 264 lb (119.7 kg), SpO2 99 %. Body mass index is 46.77 kg/m.  EKG: Normal sinus rhythm, rate 78 BPM.  Indirect Calorimeter completed today shows a VO2 of 285 and a REE of 1973.  Her calculated basal metabolic rate is thus her basal metabolic rate is better than expected.  General: Cooperative, alert, well developed, in no acute distress. HEENT: Conjunctivae and lids unremarkable. Cardiovascular: Regular rhythm.  Lungs: Normal work of breathing. Neurologic: No focal deficits.   No results found for: CREATININE,  BUN, NA, K, CL, CO2 No results found for: ALT, AST, GGT, ALKPHOS, BILITOT No results found for: HGBA1C No results found for: INSULIN No results found for: TSH No results found for: CHOL, HDL, LDLCALC, LDLDIRECT, TRIG, CHOLHDL No results found for: WBC, HGB, HCT, MCV, PLT No results found for: IRON, TIBC, FERRITIN  Attestation Statements:   Reviewed by clinician on day of visit: allergies, medications, problem list, medical history, surgical history, family history, social history, and previous encounter notes.   Trude Mcburney, am acting as Energy manager for [employer], DO.  I have reviewed  the above documentation for accuracy and completeness, and I agree with the above. Corinna Capra, DO

## 2021-09-23 ENCOUNTER — Encounter (INDEPENDENT_AMBULATORY_CARE_PROVIDER_SITE_OTHER): Payer: Self-pay | Admitting: Bariatrics

## 2021-09-23 DIAGNOSIS — R7303 Prediabetes: Secondary | ICD-10-CM | POA: Insufficient documentation

## 2021-09-23 DIAGNOSIS — E559 Vitamin D deficiency, unspecified: Secondary | ICD-10-CM | POA: Insufficient documentation

## 2021-09-23 LAB — TSH+T4F+T3FREE
Free T4: 1.01 ng/dL (ref 0.82–1.77)
T3, Free: 3.1 pg/mL (ref 2.0–4.4)
TSH: 1.7 u[IU]/mL (ref 0.450–4.500)

## 2021-09-23 LAB — COMPREHENSIVE METABOLIC PANEL
ALT: 43 IU/L — ABNORMAL HIGH (ref 0–32)
AST: 38 IU/L (ref 0–40)
Albumin/Globulin Ratio: 1.2 (ref 1.2–2.2)
Albumin: 4.1 g/dL (ref 3.8–4.8)
Alkaline Phosphatase: 97 IU/L (ref 44–121)
BUN/Creatinine Ratio: 11 (ref 9–23)
BUN: 10 mg/dL (ref 6–20)
Bilirubin Total: 0.3 mg/dL (ref 0.0–1.2)
CO2: 25 mmol/L (ref 20–29)
Calcium: 9.5 mg/dL (ref 8.7–10.2)
Chloride: 99 mmol/L (ref 96–106)
Creatinine, Ser: 0.87 mg/dL (ref 0.57–1.00)
Globulin, Total: 3.4 g/dL (ref 1.5–4.5)
Glucose: 94 mg/dL (ref 70–99)
Potassium: 4.7 mmol/L (ref 3.5–5.2)
Sodium: 138 mmol/L (ref 134–144)
Total Protein: 7.5 g/dL (ref 6.0–8.5)
eGFR: 88 mL/min/{1.73_m2} (ref >59)

## 2021-09-23 LAB — VITAMIN D 25 HYDROXY (VIT D DEFICIENCY, FRACTURES): Vit D, 25-Hydroxy: 28.2 ng/mL — ABNORMAL LOW (ref 30.0–100.0)

## 2021-09-23 LAB — LIPID PANEL WITH LDL/HDL RATIO
Cholesterol, Total: 172 mg/dL (ref 100–199)
HDL: 64 mg/dL (ref >39)
LDL Chol Calc (NIH): 87 mg/dL (ref 0–99)
LDL/HDL Ratio: 1.4 ratio (ref 0.0–3.2)
Triglycerides: 120 mg/dL (ref 0–149)
VLDL Cholesterol Cal: 21 mg/dL (ref 5–40)

## 2021-09-23 LAB — INSULIN, RANDOM: INSULIN: 13 u[IU]/mL (ref 2.6–24.9)

## 2021-09-23 LAB — HEMOGLOBIN A1C
Est. average glucose Bld gHb Est-mCnc: 126 mg/dL
Hgb A1c MFr Bld: 6 % — ABNORMAL HIGH (ref 4.8–5.6)

## 2021-10-06 ENCOUNTER — Ambulatory Visit (INDEPENDENT_AMBULATORY_CARE_PROVIDER_SITE_OTHER): Payer: BC Managed Care – PPO | Admitting: Bariatrics

## 2021-10-09 ENCOUNTER — Encounter (INDEPENDENT_AMBULATORY_CARE_PROVIDER_SITE_OTHER): Payer: Self-pay | Admitting: Bariatrics

## 2021-10-09 ENCOUNTER — Other Ambulatory Visit: Payer: Self-pay

## 2021-10-09 ENCOUNTER — Ambulatory Visit (INDEPENDENT_AMBULATORY_CARE_PROVIDER_SITE_OTHER): Payer: BC Managed Care – PPO | Admitting: Bariatrics

## 2021-10-09 ENCOUNTER — Other Ambulatory Visit (INDEPENDENT_AMBULATORY_CARE_PROVIDER_SITE_OTHER): Payer: Self-pay | Admitting: Bariatrics

## 2021-10-09 VITALS — BP 102/68 | HR 68 | Temp 98.1°F | Ht 63.0 in | Wt 262.0 lb

## 2021-10-09 DIAGNOSIS — E669 Obesity, unspecified: Secondary | ICD-10-CM

## 2021-10-09 DIAGNOSIS — E559 Vitamin D deficiency, unspecified: Secondary | ICD-10-CM

## 2021-10-09 DIAGNOSIS — R7303 Prediabetes: Secondary | ICD-10-CM

## 2021-10-09 DIAGNOSIS — Z6841 Body Mass Index (BMI) 40.0 and over, adult: Secondary | ICD-10-CM

## 2021-10-09 MED ORDER — VITAMIN D (ERGOCALCIFEROL) 1.25 MG (50000 UNIT) PO CAPS: 50000.0000 [IU] | ORAL_CAPSULE | ORAL | 0 refills | Status: DC

## 2021-10-12 ENCOUNTER — Encounter (INDEPENDENT_AMBULATORY_CARE_PROVIDER_SITE_OTHER): Payer: Self-pay | Admitting: Bariatrics

## 2021-10-12 ENCOUNTER — Telehealth (INDEPENDENT_AMBULATORY_CARE_PROVIDER_SITE_OTHER): Payer: Self-pay | Admitting: Bariatrics

## 2021-10-12 NOTE — Progress Notes (Signed)
? ? ? ?Chief Complaint:  ? ?OBESITY ?Regina Bowers is here to discuss her progress with her obesity treatment plan along with follow-up of her obesity related diagnoses. Regina Bowers is on the Category 3 Plan and states she is following her eating plan approximately 70% of the time. Regina Bowers states she is doing 0 minutes 0 times per week. ? ?Today's visit was #: 2 ?Starting weight: 264 lbs ?Starting date: 09/22/2021 ?Today's weight: 262 lbs ?Today's date: 10/09/2021 ?Total lbs lost to date: 2 lbs ?Total lbs lost since last in-office visit: 2 lbs ? ?Interim History: Regina Bowers is down 2 lbs since her first visit. The meal plan is eating to follow. She has had since major trips.  ? ?Subjective:  ? ?1. Pre-diabetes ?Regina Bowers is currently not taking medications. Her last A1C was 6.0. Her insulin was 13. ? ?2. Vitamin D insufficiency ?Regina Bowers is currently not taking medications.  ? ?Assessment/Plan:  ? ?1. Pre-diabetes ?Handouts for pre-diabetes and insulin was provided today. Oni will continue to work on weight loss, exercise, and decreasing simple carbohydrates to help decrease the risk of diabetes.  ? ?2. Vitamin D insufficiency ?Low Vitamin D level contributes to fatigue and are associated with obesity, breast, and colon cancer. Sparkle agrees to start Vitamin D. We will fill prescription Vitamin D 50,000 IU every week for 1 month with no refills and she will follow-up for routine testing of Vitamin D, at least 2-3 times per year to avoid over-replacement. ? ?- Vitamin D, Ergocalciferol, (DRISDOL) 1.25 MG (50000 UNIT) CAPS capsule; Take 1 capsule (50,000 Units total) by mouth every 7 (seven) days.  Dispense: 4 capsule; Refill: 0 ? ?3. Obesity, current BMI 46.5 ?Regina Bowers is currently in the action stage of change. As such, her goal is to continue with weight loss efforts. She has agreed to the Category 3 Plan and keeping a food journal and adhering to recommended goals of 1500 calories and 90 grams of protein.  ? ?Regina Bowers will continue meal  planning and she will continue intentional eating. We reviewed labs from 09/22/2021 CMP, Lipids, Vitamin D, A1C, insulin and thyroid panel.  ? ?Exercise goals: No exercise has been prescribed at this time. ? ?Behavioral modification strategies: increasing lean protein intake, decreasing simple carbohydrates, increasing vegetables, increasing water intake, decreasing eating out, no skipping meals, meal planning and cooking strategies, keeping healthy foods in the home, and planning for success. ? ?Regina Bowers has agreed to follow-up with our clinic in 2 weeks. She was informed of the importance of frequent follow-up visits to maximize her success with intensive lifestyle modifications for her multiple health conditions.  ? ?Objective:  ? ?Blood pressure 102/68, pulse 68, temperature 98.1 ?F (36.7 ?C), height 5\' 3"  (1.6 m), weight 262 lb (118.8 kg), SpO2 99 %. ?Body mass index is 46.41 kg/m?. ? ?General: Cooperative, alert, well developed, in no acute distress. ?HEENT: Conjunctivae and lids unremarkable. ?Cardiovascular: Regular rhythm.  ?Lungs: Normal work of breathing. ?Neurologic: No focal deficits.  ? ?Lab Results  ?Component Value Date  ? CREATININE 0.87 09/22/2021  ? BUN 10 09/22/2021  ? NA 138 09/22/2021  ? K 4.7 09/22/2021  ? CL 99 09/22/2021  ? CO2 25 09/22/2021  ? ?Lab Results  ?Component Value Date  ? ALT 43 (H) 09/22/2021  ? AST 38 09/22/2021  ? ALKPHOS 97 09/22/2021  ? BILITOT 0.3 09/22/2021  ? ?Lab Results  ?Component Value Date  ? HGBA1C 6.0 (H) 09/22/2021  ? ?Lab Results  ?Component Value Date  ? INSULIN 13.0  09/22/2021  ? ?Lab Results  ?Component Value Date  ? TSH 1.700 09/22/2021  ? ?Lab Results  ?Component Value Date  ? CHOL 172 09/22/2021  ? HDL 64 09/22/2021  ? LDLCALC 87 09/22/2021  ? TRIG 120 09/22/2021  ? ?Lab Results  ?Component Value Date  ? VD25OH 28.2 (L) 09/22/2021  ? ?No results found for: WBC, HGB, HCT, MCV, PLT ?No results found for: IRON, TIBC, FERRITIN ? ?Attestation Statements:   ? ?Reviewed by clinician on day of visit: allergies, medications, problem list, medical history, surgical history, family history, social history, and previous encounter notes. ? ?I, Jackson Latino, RMA, am acting as transcriptionist for [employer], DO. ? ?I have reviewed the above documentation for accuracy and completeness, and I agree with the above. Corinna Capra, DO ? ?

## 2021-10-12 NOTE — Telephone Encounter (Signed)
Pharmacy called to speak with nurse regarding Vit D prescription and allergies. ?

## 2021-10-12 NOTE — Telephone Encounter (Signed)
Please review

## 2021-10-22 ENCOUNTER — Encounter (INDEPENDENT_AMBULATORY_CARE_PROVIDER_SITE_OTHER): Payer: Self-pay | Admitting: Nurse Practitioner

## 2021-10-22 ENCOUNTER — Ambulatory Visit (INDEPENDENT_AMBULATORY_CARE_PROVIDER_SITE_OTHER): Payer: BC Managed Care – PPO | Admitting: Nurse Practitioner

## 2021-10-22 ENCOUNTER — Other Ambulatory Visit: Payer: Self-pay

## 2021-10-22 VITALS — BP 106/63 | HR 78 | Temp 98.0°F | Ht 63.0 in | Wt 264.0 lb

## 2021-10-22 DIAGNOSIS — E559 Vitamin D deficiency, unspecified: Secondary | ICD-10-CM | POA: Diagnosis not present

## 2021-10-22 DIAGNOSIS — E669 Obesity, unspecified: Secondary | ICD-10-CM

## 2021-10-22 DIAGNOSIS — Z6841 Body Mass Index (BMI) 40.0 and over, adult: Secondary | ICD-10-CM | POA: Diagnosis not present

## 2021-10-22 DIAGNOSIS — R7303 Prediabetes: Secondary | ICD-10-CM | POA: Diagnosis not present

## 2021-10-22 MED ORDER — METFORMIN HCL 500 MG PO TABS: 500.0000 mg | ORAL_TABLET | Freq: Every day | ORAL | 0 refills | Status: DC

## 2021-10-26 NOTE — Progress Notes (Signed)
? ? ? ?Chief Complaint:  ? ?OBESITY ?Regina Bowers is here to discuss her progress with her obesity treatment plan along with follow-up of her obesity related diagnoses. Regina Bowers is on the Category 3 Plan and keeping a food journal and adhering to recommended goals of 1500 calories and 90 grams of protein and states she is following her eating plan approximately 50% of the time. Regina Bowers states she is using the treadmill for 30 minutes 3 times per week. ? ?Today's visit was #: 3 ?Starting weight: 264 lbs ?Starting date: 09/22/2021 ?Today's weight: 264 lbs ?Today's date: 10/22/2021 ?Total lbs lost to date: 0 ?Total lbs lost since last in-office visit: 0 ? ?Interim History: Regina Bowers travels frequently from January -April for her job.  She works as a Corporate investment banker for [employer].  She struggles with following the Category 3 plan due to traveling.  She feels that she would be able to follow the Category 3 plan during the summer and journaling would work better when traveling. ? ?Subjective:  ? ?1. Vitamin D insufficiency ?Last vitamin D was 23.2.  Took vitamin D in the past, unsure of dose, and stopped due to hives.  Currently takes a multivitamin and plans to start a prenatal vitamin per GYN recommendation.  Not preventing pregnancy. ? ?2. Pre-diabetes ?Last A1c was 6.0 and insulin was 13.0. ? ?Assessment/Plan:  ? ?1. Vitamin D insufficiency ?Start PNV as directed by GYN. ? ?2. Pre-diabetes ?Start metformin 500 mg.  Side effects discussed. ?Handouts:  Metformin, Prediabetes/Insulin Resistance. ? ?- Start metFORMIN (GLUCOPHAGE) 500 MG tablet; Take 1 tablet (500 mg total) by mouth daily with breakfast.  Dispense: 30 tablet; Refill: 0 ? ?3. Obesity, current BMI 46.8 ? ?Mekaela is currently in the action stage of change. As such, her goal is to continue with weight loss efforts. She has agreed to the Category 3 Plan and keeping a food journal and adhering to recommended goals of 1500 calories and 100 grams of protein when  traveling.  ? ?Exercise goals:  As is. ? ?Behavioral modification strategies: increasing lean protein intake, increasing water intake, no skipping meals, and meal planning and cooking strategies. ? ?Regina Bowers has agreed to follow-up with our clinic in 2 weeks. She was informed of the importance of frequent follow-up visits to maximize her success with intensive lifestyle modifications for her multiple health conditions.  ? ?Objective:  ? ?Blood pressure 106/63, pulse 78, temperature 98 ?F (36.7 ?C), height 5\' 3"  (1.6 m), weight 264 lb (119.7 kg), SpO2 98 %. ?Body mass index is 46.77 kg/m?. ? ?General: Cooperative, alert, well developed, in no acute distress. ?HEENT: Conjunctivae and lids unremarkable. ?Cardiovascular: Regular rhythm.  ?Lungs: Normal work of breathing. ?Neurologic: No focal deficits.  ? ?Lab Results  ?Component Value Date  ? CREATININE 0.87 09/22/2021  ? BUN 10 09/22/2021  ? NA 138 09/22/2021  ? K 4.7 09/22/2021  ? CL 99 09/22/2021  ? CO2 25 09/22/2021  ? ?Lab Results  ?Component Value Date  ? ALT 43 (H) 09/22/2021  ? AST 38 09/22/2021  ? ALKPHOS 97 09/22/2021  ? BILITOT 0.3 09/22/2021  ? ?Lab Results  ?Component Value Date  ? HGBA1C 6.0 (H) 09/22/2021  ? ?Lab Results  ?Component Value Date  ? INSULIN 13.0 09/22/2021  ? ?Lab Results  ?Component Value Date  ? TSH 1.700 09/22/2021  ? ?Lab Results  ?Component Value Date  ? CHOL 172 09/22/2021  ? HDL 64 09/22/2021  ? LDLCALC 87 09/22/2021  ? TRIG 120 09/22/2021  ? ?  Lab Results  ?Component Value Date  ? VD25OH 28.2 (L) 09/22/2021  ? ?Attestation Statements:  ? ?Reviewed by clinician on day of visit: allergies, medications, problem list, medical history, surgical history, family history, social history, and previous encounter notes. ? ?I, Insurance claims handler, CMA, am acting as transcriptionist for Irene Limbo, FNP. ? ?I have reviewed the above documentation for accuracy and completeness, and I agree with the above. Irene Limbo, FNP  ?

## 2021-11-09 ENCOUNTER — Ambulatory Visit (INDEPENDENT_AMBULATORY_CARE_PROVIDER_SITE_OTHER): Payer: BC Managed Care – PPO | Admitting: Nurse Practitioner

## 2021-11-09 ENCOUNTER — Encounter (INDEPENDENT_AMBULATORY_CARE_PROVIDER_SITE_OTHER): Payer: Self-pay | Admitting: Nurse Practitioner

## 2021-11-09 VITALS — BP 118/75 | HR 64 | Temp 97.9°F | Ht 63.0 in | Wt 264.0 lb

## 2021-11-09 DIAGNOSIS — Z6841 Body Mass Index (BMI) 40.0 and over, adult: Secondary | ICD-10-CM | POA: Diagnosis not present

## 2021-11-09 DIAGNOSIS — E669 Obesity, unspecified: Secondary | ICD-10-CM | POA: Diagnosis not present

## 2021-11-09 DIAGNOSIS — E559 Vitamin D deficiency, unspecified: Secondary | ICD-10-CM

## 2021-11-09 DIAGNOSIS — R7303 Prediabetes: Secondary | ICD-10-CM | POA: Diagnosis not present

## 2021-11-09 MED ORDER — METFORMIN HCL 500 MG PO TABS: 500.0000 mg | ORAL_TABLET | Freq: Every day | ORAL | 0 refills | Status: DC

## 2021-11-11 NOTE — Progress Notes (Signed)
? ? ? ?Chief Complaint:  ? ?OBESITY ?Regina Bowers is here to discuss her progress with her obesity treatment plan along with follow-up of her obesity related diagnoses. Leather is on the Category 3 Plan and keeping a food journal and adhering to recommended goals of 1500 calories and 100 grams of protein and states she is following her eating plan approximately 50% of the time. [name] states she is walking for 45 minutes 3 times per week. ? ?Today's visit was #: 4 ?Starting weight: 264 lbs ?Starting date: 09/22/2021 ?Today's weight: 264 lbs ?Today's date: 11/09/2021 ?Total lbs lost to date: 0 ?Total lbs lost since last in-office visit: 0 ? ?Interim History: [name] traveled multiple times since her last visit. She feels she's able to lose weight easier when not traveling. After this week she won't travel until late September. She is struggling with meeting water goals.  ? ?Subjective:  ? ?1. Pre-diabetes ?Hubert is currently taking Metformin 500 mg. She denies side effects. She notes it helps with hunger. Metformin allows her to eat and helps her to stop when she feels full.  ? ?2. Vitamin D insufficiency ?[name] is taking prenatal Vitamin D 800 IU.She never started prescription Vitamin D. She reports she took Vitamin D 50,000 IU in the past and stopped  due to side effects of hives. ? ?Assessment/Plan:  ? ?1. Pre-diabetes ?We will refill Metformin 500 mg for 1 month with 1 refills. We discussed side effects. Henreitta will continue to work on weight loss, exercise, and decreasing simple carbohydrates to help decrease the risk of diabetes.  ? ?- metFORMIN (GLUCOPHAGE) 500 MG tablet; Take 1 tablet (500 mg total) by mouth daily with breakfast.  Dispense: 30 tablet; Refill: 0 ? ?2. Vitamin D insufficiency ?Low Vitamin D level contributes to fatigue and are associated with obesity, breast, and colon cancer. Rayyan agrees to continue to take prescription Prenatal Vitamin D 800 IU daily and she will follow-up for routine testing of  Vitamin D, at least 2-3 times per year to avoid over-replacement. ? ?3. Obesity, current BMI 46.9 ?[name] is currently in the action stage of change. As such, her goal is to continue with weight loss efforts. She has agreed to the Category 3 Plan.  ? ?Exercise goals:  As is. ? ?Behavioral modification strategies: increasing lean protein intake, increasing water intake, and planning for success. ? ?Lani has agreed to follow-up with our clinic in 4 weeks. She was informed of the importance of frequent follow-up visits to maximize her success with intensive lifestyle modifications for her multiple health conditions.  ? ?Objective:  ? ?Blood pressure 118/75, pulse 64, temperature 97.9 ?F (36.6 ?C), height 5\' 3"  (1.6 m), weight 264 lb (119.7 kg), SpO2 99 %. ?Body mass index is 46.77 kg/m?. ? ?General: Cooperative, alert, well developed, in no acute distress. ?HEENT: Conjunctivae and lids unremarkable. ?Cardiovascular: Regular rhythm.  ?Lungs: Normal work of breathing. ?Neurologic: No focal deficits.  ? ?Lab Results  ?Component Value Date  ? CREATININE 0.87 09/22/2021  ? BUN 10 09/22/2021  ? NA 138 09/22/2021  ? K 4.7 09/22/2021  ? CL 99 09/22/2021  ? CO2 25 09/22/2021  ? ?Lab Results  ?Component Value Date  ? ALT 43 (H) 09/22/2021  ? AST 38 09/22/2021  ? ALKPHOS 97 09/22/2021  ? BILITOT 0.3 09/22/2021  ? ?Lab Results  ?Component Value Date  ? HGBA1C 6.0 (H) 09/22/2021  ? ?Lab Results  ?Component Value Date  ? INSULIN 13.0 09/22/2021  ? ?Lab Results  ?  Component Value Date  ? TSH 1.700 09/22/2021  ? ?Lab Results  ?Component Value Date  ? CHOL 172 09/22/2021  ? HDL 64 09/22/2021  ? LDLCALC 87 09/22/2021  ? TRIG 120 09/22/2021  ? ?Lab Results  ?Component Value Date  ? VD25OH 28.2 (L) 09/22/2021  ? ?No results found for: WBC, HGB, HCT, MCV, PLT ?No results found for: IRON, TIBC, FERRITIN ? ?Attestation Statements:  ? ?Reviewed by clinician on day of visit: allergies, medications, problem list, medical history, surgical  history, family history, social history, and previous encounter notes. ? ?I, Jackson Latino, RMA, am acting as Energy manager for Irene Limbo, FNP.  ? ?I have reviewed the above documentation for accuracy and completeness, and I agree with the above. Irene Limbo, FNP  ?

## 2021-12-09 ENCOUNTER — Encounter (INDEPENDENT_AMBULATORY_CARE_PROVIDER_SITE_OTHER): Payer: Self-pay | Admitting: Adult Health

## 2021-12-09 ENCOUNTER — Ambulatory Visit (INDEPENDENT_AMBULATORY_CARE_PROVIDER_SITE_OTHER): Payer: BC Managed Care – PPO | Admitting: Adult Health

## 2021-12-09 VITALS — BP 110/74 | HR 79 | Temp 98.5°F | Ht 63.0 in | Wt 261.0 lb

## 2021-12-09 DIAGNOSIS — R7303 Prediabetes: Secondary | ICD-10-CM | POA: Diagnosis not present

## 2021-12-09 DIAGNOSIS — R194 Change in bowel habit: Secondary | ICD-10-CM

## 2021-12-09 DIAGNOSIS — Z6841 Body Mass Index (BMI) 40.0 and over, adult: Secondary | ICD-10-CM

## 2021-12-09 DIAGNOSIS — Z9189 Other specified personal risk factors, not elsewhere classified: Secondary | ICD-10-CM

## 2021-12-09 DIAGNOSIS — E669 Obesity, unspecified: Secondary | ICD-10-CM

## 2021-12-09 MED ORDER — METFORMIN HCL 500 MG PO TABS: 500.0000 mg | ORAL_TABLET | Freq: Every day | ORAL | 0 refills | Status: DC

## 2021-12-09 MED ORDER — METFORMIN HCL 500 MG PO TABS: ORAL_TABLET | ORAL | 0 refills | Status: DC

## 2021-12-14 NOTE — Progress Notes (Signed)
? ? ? ?Chief Complaint:  ? ?OBESITY ?Regina Bowers is here to discuss her progress with her obesity treatment plan along with follow-up of her obesity related diagnoses. Regina Bowers is on the Category 3 Plan and states she is following her eating plan approximately 80% of the time. Regina Bowers states she is doing cardio 45 minutes 3 times per week. ? ?Today's visit was #: 5 ?Starting weight: 264 lbs ?Starting date: 09/22/2021 ?Today's weight: 261 lbs ?Today's date: 12/09/2021 ?Total lbs lost to date: 3 ?Total lbs lost since last in-office visit: 3 ? ?Interim History:  ?Metformin has helped decrease cravings, however still experiencing polyphagia mid day on weekends.  ?She feels that she gained weight in 2019 after meeting her current boyfriend.  She estimated to gained 10 lbs in 2019 due to love. She was able to loss some weight in early 2020. ?COVID-19 pandemic triggered increase in weight by 20-30 lbs.  ? 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 ? ?Subjective:  ? ?1. Pre-diabetes ?On 09/22/21 CMP-GFR 88. Her A1c at 6.0.  ?She denies 1st degree family history T2D. ? ?2. Change in bowel habits ?Prior to [facility], she had a BM every 2-3 days.  ?Since eating on prescribed eating plan , she now reports BM every other day. ?She denies abd pain or hematochezia. ? ?3. At risk for diarrhea ?Regina Bowers is at higher risk of diarrhea due to medications- specifically an increase in Metformin. ? ?Assessment/Plan:  ? ?1. Pre-diabetes ?Will increase Metformin 500 mg 2 tablets with breakfast.  Will refill for 1 month with no refills. ? ?-Refill  metFORMIN (GLUCOPHAGE) 500 MG tablet; 2 tabs at breakfast  Dispense: 60 tablet; Refill: 0 ? ?2. Change in bowel habits ?Regina Bowers will continue with Cat 3 meal plan and regular exercise. ? ?3. At risk for diarrhea ?Regina Bowers was given approximately 15 minutes of diarrhea prevention counseling today. She  is 36 y.o. female and has risk factors for diarrhea including medications and changes in diet. We discussed intensive lifestyle modifications today with an emphasis on specific weight loss instructions including dietary strategies.  ? ?Repetitive spaced learning was employed today to elicit superior memory formation and behavioral change. ? ? ?4. Obesity, current BMI 46.3 ?Regina Bowers is currently in the action stage of change. As such, her goal is to continue with weight loss efforts. She has agreed to the Category 3 Plan.  ? ?Exercise goals: As is. ? ?Behavioral modification strategies: increasing lean protein intake, decreasing simple carbohydrates, meal planning and cooking strategies, keeping healthy foods in the home, and planning for success. ? ?Regina Bowers has agreed to follow-up with our clinic in 2 weeks. She was informed of the importance of frequent follow-up visits to maximize her success with intensive lifestyle modifications for her multiple health conditions.  ? ?Objective:  ? ?Blood pressure 110/74, pulse 79, temperature 98.5 ?F (36.9 ?C), height 5\' 3"  (1.6 m), weight 261 lb (118.4 kg), SpO2 99 %. ?Body mass index is 46.23 kg/m?. ? ?General: Cooperative, alert, well developed, in no acute distress. ?HEENT: Conjunctivae and lids unremarkable. ?Cardiovascular: Regular rhythm.  ?Lungs: Normal work of breathing. ?Neurologic: No focal deficits.  ? ?Lab Results  ?Component Value Date  ? CREATININE 0.87 09/22/2021  ? BUN 10 09/22/2021  ? NA 138 09/22/2021  ? K 4.7 09/22/2021  ? CL 99 09/22/2021  ? CO2 25 09/22/2021  ? ?Lab Results  ?Component Value  Date  ? ALT 43 (H) 09/22/2021  ? AST 38 09/22/2021  ? ALKPHOS 97 09/22/2021  ? BILITOT 0.3 09/22/2021  ? ?Lab Results  ?Component Value Date  ? HGBA1C 6.0 (H) 09/22/2021  ? ?Lab Results  ?Component Value Date  ? INSULIN 13.0 09/22/2021  ? ?Lab Results  ?Component Value Date  ? TSH 1.700 09/22/2021  ? ?Lab Results  ?Component Value Date  ? CHOL 172 09/22/2021  ? HDL 64  09/22/2021  ? [place] 87 09/22/2021  ? TRIG 120 09/22/2021  ? ?Lab Results  ?Component Value Date  ? VD25OH 28.2 (L) 09/22/2021  ? ?No results found for: WBC, HGB, HCT, MCV, PLT ?No results found for: IRON, TIBC, FERRITIN ? ?Attestation Statements:  ? ?Reviewed by clinician on day of visit: allergies, medications, problem list, medical history, surgical history, family history, social history, and previous encounter notes. ? ?I, Regina Bowers, RMA, am acting as transcriptionist for Regina Marble, NP. ? ?I have reviewed the above documentation for accuracy and completeness, and I agree with the above. -  Regina Bowers d. Yoshi Vicencio, NP-C ?

## 2021-12-15 DIAGNOSIS — R194 Change in bowel habit: Secondary | ICD-10-CM | POA: Insufficient documentation

## 2021-12-21 ENCOUNTER — Ambulatory Visit (INDEPENDENT_AMBULATORY_CARE_PROVIDER_SITE_OTHER): Payer: BC Managed Care – PPO | Admitting: Adult Health

## 2022-01-05 ENCOUNTER — Ambulatory Visit (INDEPENDENT_AMBULATORY_CARE_PROVIDER_SITE_OTHER): Payer: BC Managed Care – PPO | Admitting: Nurse Practitioner

## 2022-01-05 ENCOUNTER — Encounter (INDEPENDENT_AMBULATORY_CARE_PROVIDER_SITE_OTHER): Payer: Self-pay | Admitting: Nurse Practitioner

## 2022-01-05 VITALS — BP 95/62 | HR 73 | Temp 98.3°F | Ht 63.0 in | Wt 263.0 lb

## 2022-01-05 DIAGNOSIS — R7303 Prediabetes: Secondary | ICD-10-CM

## 2022-01-05 DIAGNOSIS — E669 Obesity, unspecified: Secondary | ICD-10-CM | POA: Diagnosis not present

## 2022-01-05 DIAGNOSIS — Z6841 Body Mass Index (BMI) 40.0 and over, adult: Secondary | ICD-10-CM

## 2022-01-05 MED ORDER — METFORMIN HCL 500 MG PO TABS: ORAL_TABLET | ORAL | 0 refills | Status: DC

## 2022-01-06 NOTE — Progress Notes (Signed)
Chief Complaint:   OBESITY Regina Bowers is here to discuss her progress with her obesity treatment plan along with follow-up of her obesity related diagnoses. [name] is on the Category 3 Plan and states she is following her eating plan approximately 80% of the time. [name] states she is doing gym exercise for 45-60 minutes 3 times per week.  Today's visit was #: 6 Starting weight: 264 lbs Starting date: 09/22/2021 Today's weight: 263 lbs Today's date: 01/05/2022 Total lbs lost to date: 1 lb Total lbs lost since last in-office visit: 0  Interim History: Regina Bowers had multiple celebrations over the past 2 weeks. 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 She is not traveling with her job until September. She notes more stress during the Summer because of her job and tends to gain weight during the Summer. She want to lose weight for vacation in September. She finds that she overall does better and finds that she feels better when she goes to the gym on a regular basis.  She denies hunger or cravings.   Subjective:   1. Pre-diabetes [name] is taking Metformin 500 mg 2 at breakfast. She is doing well. She denies side effects. Her last A1C was 6.0. She states the increase in Metformin helped with polyphagia and cravings.   Assessment/Plan:   1. Pre-diabetes [name] will continue to work on weight loss, exercise, and decreasing simple carbohydrates to help decrease the risk of diabetes. We will refill Metformin 500 mg for 1 month with no refills.   - metFORMIN (GLUCOPHAGE) 500 MG tablet; 2 tabs at breakfast  Dispense: 60 tablet; Refill: 0  2. Obesity, with current BMI of 46.6 Regina Bowers is currently in the action stage of change. As such, her goal is to continue with weight loss efforts. She has agreed to following a lower carbohydrate, vegetable and lean protein rich diet plan.   We will obtain labs at next visit.   Exercise goals:  As is.   Behavioral modification strategies:  increasing lean protein intake, increasing vegetables, increasing water intake, no skipping meals, meal planning and cooking strategies, and planning for success.  Regina Bowers has agreed to follow-up with our clinic in 2 weeks. She was informed of the importance of frequent follow-up visits to maximize her success with intensive lifestyle modifications for her multiple health conditions.   Objective:   Blood pressure 95/62, pulse 73, temperature 98.3 F (36.8 C), height 5\' 3"  (1.6 m), weight 263 lb (119.3 kg), SpO2 98 %. Body mass index is 46.59 kg/m.  General: Cooperative, alert, well developed, in no acute distress. HEENT: Conjunctivae and lids unremarkable. Cardiovascular: Regular rhythm.  Lungs: Normal work of breathing. Neurologic: No focal deficits.   Lab Results  Component Value Date   CREATININE 0.87 09/22/2021   BUN 10 09/22/2021   NA 138 09/22/2021   K 4.7 09/22/2021   CL 99 09/22/2021   CO2 25 09/22/2021   Lab Results  Component Value Date   ALT 43 (H) 09/22/2021   AST 38 09/22/2021   ALKPHOS 97 09/22/2021   BILITOT 0.3 09/22/2021   Lab Results  Component Value Date   HGBA1C 6.0 (H) 09/22/2021   Lab Results  Component Value Date   INSULIN 13.0 09/22/2021   Lab Results  Component Value Date   TSH 1.700 09/22/2021   Lab Results  Component Value Date   CHOL 172 09/22/2021   HDL 64 09/22/2021   LDLCALC 87 09/22/2021  TRIG 120 09/22/2021   Lab Results  Component Value Date   VD25OH 28.2 (L) 09/22/2021   No results found for: WBC, HGB, HCT, MCV, PLT No results found for: IRON, TIBC, FERRITIN  Attestation Statements:   Reviewed by clinician on day of visit: allergies, medications, problem list, medical history, surgical history, family history, social history, and previous encounter notes.  I, Jackson Latino, RMA, am acting as Energy manager for Irene Limbo, FNP.  I have reviewed the above documentation for accuracy and completeness, and I  agree with the above. Irene Limbo, FNP

## 2022-01-25 ENCOUNTER — Ambulatory Visit (INDEPENDENT_AMBULATORY_CARE_PROVIDER_SITE_OTHER): Payer: BC Managed Care – PPO | Admitting: Nurse Practitioner

## 2022-01-27 ENCOUNTER — Ambulatory Visit (INDEPENDENT_AMBULATORY_CARE_PROVIDER_SITE_OTHER): Payer: BC Managed Care – PPO | Admitting: Nurse Practitioner

## 2022-01-27 ENCOUNTER — Encounter (INDEPENDENT_AMBULATORY_CARE_PROVIDER_SITE_OTHER): Payer: Self-pay | Admitting: Nurse Practitioner

## 2022-01-27 VITALS — BP 112/72 | HR 82 | Temp 98.0°F | Ht 63.0 in | Wt 264.0 lb

## 2022-01-27 DIAGNOSIS — Z7984 Long term (current) use of oral hypoglycemic drugs: Secondary | ICD-10-CM | POA: Diagnosis not present

## 2022-01-27 DIAGNOSIS — Z6841 Body Mass Index (BMI) 40.0 and over, adult: Secondary | ICD-10-CM

## 2022-01-27 DIAGNOSIS — R7303 Prediabetes: Secondary | ICD-10-CM

## 2022-01-27 DIAGNOSIS — E669 Obesity, unspecified: Secondary | ICD-10-CM

## 2022-01-27 MED ORDER — METFORMIN HCL 500 MG PO TABS: ORAL_TABLET | ORAL | 0 refills | Status: DC

## 2022-01-28 NOTE — Progress Notes (Signed)
Chief Complaint:   OBESITY Regina Bowers is here to discuss her progress with her obesity treatment plan along with follow-up of her obesity related diagnoses. Regina Bowers is on the Category 3 Plan and states she is following her eating plan approximately 70% of the time. Regina Bowers states she is doing work out videos 30 minutes 3 times per week.  Today's visit was #: 7 Starting weight: 264 lbs Starting date: 09/22/2021 Today's weight: 264 lbs Today's date: 01/27/2022 Total lbs lost to date: 0 lbs Total lbs lost since last in-office visit: 0  Interim History: Things are going well. [place] reports, does well after she sees Regina Bowers the first week and then she gets off track the next week. She is struggling with water intake and has not been going to the gym. She denies hunger and cravings. Does well with lunch and dinner but struggles with breakfast.  Subjective:   1. Pre-diabetes Regina Bowers is currently taking Metformin 500 mg, 2 at breakfast. Reports side effect of constipation. Denies polyphagia or cravings.  Assessment/Plan:   1. Pre-diabetes We will refill Metformin 500 mg 2 tabs at breakfast for 1 month with 0 refills. Side effects discussed.   -Refill metFORMIN (GLUCOPHAGE) 500 MG tablet; 2 tabs at breakfast  Dispense: 60 tablet; Refill: 0  2. Obesity, with current BMI of 46.9 Regina Bowers is currently in the action stage of change. As such, her goal is to continue with weight loss efforts. She has agreed to the Category 3 Plan.   Exercise goals: As is.  We will obtain labs at next visit.  Behavioral modification strategies: increasing water intake, no skipping meals, and planning for success.  Regina Bowers has agreed to follow-up with our clinic in 2 weeks. She was informed of the importance of frequent follow-up visits to maximize her success with intensive lifestyle modifications for her multiple health conditions.   Objective:   Blood pressure 112/72, pulse 82, temperature 98 F (36.7 C), height  5\' 3"  (1.6 m), weight 264 lb (119.7 kg), SpO2 99 %. Body mass index is 46.77 kg/m.  General: Cooperative, alert, well developed, in no acute distress. HEENT: Conjunctivae and lids unremarkable. Cardiovascular: Regular rhythm.  Lungs: Normal work of breathing. Neurologic: No focal deficits.   Lab Results  Component Value Date   CREATININE 0.87 09/22/2021   BUN 10 09/22/2021   NA 138 09/22/2021   K 4.7 09/22/2021   CL 99 09/22/2021   CO2 25 09/22/2021   Lab Results  Component Value Date   ALT 43 (H) 09/22/2021   AST 38 09/22/2021   ALKPHOS 97 09/22/2021   BILITOT 0.3 09/22/2021   Lab Results  Component Value Date   HGBA1C 6.0 (H) 09/22/2021   Lab Results  Component Value Date   INSULIN 13.0 09/22/2021   Lab Results  Component Value Date   TSH 1.700 09/22/2021   Lab Results  Component Value Date   CHOL 172 09/22/2021   HDL 64 09/22/2021   LDLCALC 87 09/22/2021   TRIG 120 09/22/2021   Lab Results  Component Value Date   VD25OH 28.2 (L) 09/22/2021   No results found for: "WBC", "HGB", "HCT", "MCV", "PLT" No results found for: "IRON", "TIBC", "FERRITIN"  Attestation Statements:   Reviewed by clinician on day of visit: allergies, medications, problem list, medical history, surgical history, family history, social history, and previous encounter notes.  I, Brendell Tyus, RMA, am acting as transcriptionist for 09/24/2021, FNP..  I have reviewed the above documentation for accuracy  and completeness, and I agree with the above. Irene Limbo, FNP

## 2022-02-10 ENCOUNTER — Ambulatory Visit (INDEPENDENT_AMBULATORY_CARE_PROVIDER_SITE_OTHER): Payer: BC Managed Care – PPO | Admitting: Nurse Practitioner

## 2022-02-24 ENCOUNTER — Ambulatory Visit (INDEPENDENT_AMBULATORY_CARE_PROVIDER_SITE_OTHER): Payer: BC Managed Care – PPO | Admitting: Nurse Practitioner

## 2022-03-03 ENCOUNTER — Encounter (INDEPENDENT_AMBULATORY_CARE_PROVIDER_SITE_OTHER): Payer: Self-pay | Admitting: Adult Health

## 2022-03-03 ENCOUNTER — Ambulatory Visit (INDEPENDENT_AMBULATORY_CARE_PROVIDER_SITE_OTHER): Payer: BC Managed Care – PPO | Admitting: Adult Health

## 2022-03-03 VITALS — BP 111/77 | HR 74 | Temp 98.5°F | Ht 63.0 in | Wt 261.0 lb

## 2022-03-03 DIAGNOSIS — E669 Obesity, unspecified: Secondary | ICD-10-CM | POA: Diagnosis not present

## 2022-03-03 DIAGNOSIS — R7303 Prediabetes: Secondary | ICD-10-CM | POA: Diagnosis not present

## 2022-03-03 DIAGNOSIS — Z6841 Body Mass Index (BMI) 40.0 and over, adult: Secondary | ICD-10-CM

## 2022-03-03 DIAGNOSIS — Z9189 Other specified personal risk factors, not elsewhere classified: Secondary | ICD-10-CM

## 2022-03-03 DIAGNOSIS — E559 Vitamin D deficiency, unspecified: Secondary | ICD-10-CM | POA: Diagnosis not present

## 2022-03-03 MED ORDER — METFORMIN HCL 500 MG PO TABS: ORAL_TABLET | ORAL | 0 refills | Status: AC

## 2022-03-04 LAB — COMPREHENSIVE METABOLIC PANEL
ALT: 26 IU/L (ref 0–32)
AST: 26 IU/L (ref 0–40)
Albumin/Globulin Ratio: 1.5 (ref 1.2–2.2)
Albumin: 4.3 g/dL (ref 3.9–4.9)
Alkaline Phosphatase: 93 IU/L (ref 44–121)
BUN/Creatinine Ratio: 12 (ref 9–23)
BUN: 9 mg/dL (ref 6–20)
Bilirubin Total: 0.4 mg/dL (ref 0.0–1.2)
CO2: 25 mmol/L (ref 20–29)
Calcium: 9.9 mg/dL (ref 8.7–10.2)
Chloride: 98 mmol/L (ref 96–106)
Creatinine, Ser: 0.75 mg/dL (ref 0.57–1.00)
Globulin, Total: 2.9 g/dL (ref 1.5–4.5)
Glucose: 79 mg/dL (ref 70–99)
Potassium: 4.6 mmol/L (ref 3.5–5.2)
Sodium: 138 mmol/L (ref 134–144)
Total Protein: 7.2 g/dL (ref 6.0–8.5)
eGFR: 104 mL/min/{1.73_m2} (ref >59)

## 2022-03-04 LAB — HEMOGLOBIN A1C
Est. average glucose Bld gHb Est-mCnc: 123 mg/dL
Hgb A1c MFr Bld: 5.9 % — ABNORMAL HIGH (ref 4.8–5.6)

## 2022-03-04 LAB — VITAMIN D 25 HYDROXY (VIT D DEFICIENCY, FRACTURES): Vit D, 25-Hydroxy: 35.3 ng/mL (ref 30.0–100.0)

## 2022-03-04 LAB — INSULIN, RANDOM: INSULIN: 7.3 u[IU]/mL (ref 2.6–24.9)

## 2022-03-04 LAB — VITAMIN B12: Vitamin B-12: 464 pg/mL (ref 232–1245)

## 2022-03-10 ENCOUNTER — Encounter (INDEPENDENT_AMBULATORY_CARE_PROVIDER_SITE_OTHER): Payer: Self-pay

## 2022-03-10 NOTE — Progress Notes (Unsigned)
Chief Complaint:   OBESITY Regina Bowers is here to discuss her progress with her obesity treatment plan along with follow-up of her obesity related diagnoses. Regina Bowers is on the Category 3 Plan and states she is following her eating plan approximately 50% of the time. Regina Bowers states she is exercising 0 minutes 0 times per week.  Today's visit was #: 8 Starting weight: 264 lbs Starting date: 09/22/2021 Today's weight: 261 lbs Today's date: 03/03/2022 Total lbs lost to date: 3 lbs Total lbs lost since last in-office visit: 3  Interim History: Regina Bowers started [facility] 09/22/21. Her follow ups have been spaced out due to demanding work schedule with two jobs. 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 Part time job, [employer] (20 hours a week). Note: she is not on birth control and not trying to avoid pregnancy.  Subjective:   1. Vitamin D insufficiency Regina Bowers is currently on over the counter prenatal vitamin and over the counter *** multivitamin.  2. Pre-diabetes Regina Bowers has been on Metformin 500 mg twice a day since 12/09/21. She denies any current GI upset at present. Her father had Type 2 diabetes--he passed from CVA at age 51. She on Saxenda previously in 2018---she lost 30 lbs and tolerated it well.  3. At risk for diabetes mellitus Regina Bowers is at higher than average risk for developing diabetes due to obesity.   Assessment/Plan:   1. Vitamin D insufficiency We will obtain labs today.  - VITAMIN D 25 Hydroxy (Vit-D Deficiency, Fractures)  2. Pre-diabetes We will obtain labs today. We will refill Metformin 500 mg twice a day for 1 month with 0 refills.  -Refill metFORMIN (GLUCOPHAGE) 500 MG tablet; 2 tabs at breakfast  Dispense: 60 tablet; Refill: 0  - Comprehensive metabolic panel - Hemoglobin A1c - Insulin, random - Vitamin B12  3. At risk for diabetes mellitus Regina Bowers was given approximately 15 minutes of diabetic education and counseling today. We discussed  intensive lifestyle modifications today with an emphasis on weight loss as well as increasing exercise and decreasing simple carbohydrates in her diet. We also reviewed medication options with an emphasis on risk versus benefits of those discussed.  Repetitive spaced learning was employed today to elicit superior memory formation and behavioral change.  4. Obesity with current BMI of 46.3 Regina Bowers is currently in the action stage of change. As such, her goal is to continue with weight loss efforts. She has agreed to the Category 3 Plan.   Exercise goals: Regina Bowers is to increase daily walking.  Behavioral modification strategies: increasing lean protein intake, decreasing simple carbohydrates, meal planning and cooking strategies, keeping healthy foods in the home, and planning for success.  Regina Bowers has agreed to follow-up with our clinic in 3 weeks. She was informed of the importance of frequent follow-up visits to maximize her success with intensive lifestyle modifications for her multiple health conditions.   Regina Bowers was informed we would discuss her lab results at her next visit unless there is a critical issue that needs to be addressed sooner. Regina Bowers agreed to keep her next visit at the agreed upon time to discuss these results.  Objective:   Blood pressure 111/77, pulse 74, temperature 98.5 F (36.9 C), height 5\' 3"  (1.6 m), weight 261 lb (118.4 kg), SpO2 98 %. Body mass index is 46.23 kg/m.  General: Cooperative, alert, well developed, in no acute distress. HEENT: Conjunctivae and lids unremarkable. Cardiovascular: Regular rhythm.  Lungs: Normal work of breathing. Neurologic: No  focal deficits.   Lab Results  Component Value Date   CREATININE 0.75 03/03/2022   BUN 9 03/03/2022   NA 138 03/03/2022   K 4.6 03/03/2022   CL 98 03/03/2022   CO2 25 03/03/2022   Lab Results  Component Value Date   ALT 26 03/03/2022   AST 26 03/03/2022   ALKPHOS 93 03/03/2022   BILITOT 0.4  03/03/2022   Lab Results  Component Value Date   HGBA1C 5.9 (H) 03/03/2022   HGBA1C 6.0 (H) 09/22/2021   Lab Results  Component Value Date   INSULIN 7.3 03/03/2022   INSULIN 13.0 09/22/2021   Lab Results  Component Value Date   TSH 1.700 09/22/2021   Lab Results  Component Value Date   CHOL 172 09/22/2021   HDL 64 09/22/2021   LDLCALC 87 09/22/2021   TRIG 120 09/22/2021   Lab Results  Component Value Date   VD25OH 35.3 03/03/2022   VD25OH 28.2 (L) 09/22/2021   No results found for: "WBC", "HGB", "HCT", "MCV", "PLT" No results found for: "IRON", "TIBC", "FERRITIN"  Attestation Statements:   Reviewed by clinician on day of visit: allergies, medications, problem list, medical history, surgical history, family history, social history, and previous encounter notes.  I, Sacora Hawbaker, RMA, am acting as transcriptionist for William Hamburger, NP.  I have reviewed the above documentation for accuracy and completeness, and I agree with the above. -  ***

## 2022-03-30 ENCOUNTER — Ambulatory Visit (INDEPENDENT_AMBULATORY_CARE_PROVIDER_SITE_OTHER): Payer: BC Managed Care – PPO | Admitting: Adult Health

## 2022-08-24 ENCOUNTER — Encounter: Payer: Self-pay | Admitting: Podiatry

## 2022-08-24 ENCOUNTER — Ambulatory Visit (INDEPENDENT_AMBULATORY_CARE_PROVIDER_SITE_OTHER): Payer: BC Managed Care – PPO

## 2022-08-24 ENCOUNTER — Ambulatory Visit: Payer: BC Managed Care – PPO | Admitting: Podiatry

## 2022-08-24 DIAGNOSIS — M722 Plantar fascial fibromatosis: Secondary | ICD-10-CM

## 2022-08-24 MED ORDER — METHYLPREDNISOLONE 4 MG PO TBPK: ORAL_TABLET | ORAL | 0 refills | Status: AC

## 2022-08-24 NOTE — Progress Notes (Signed)
  Subjective:  Patient ID: Regina Bowers, female    DOB: 1986-02-27,   MRN: [id number]  Chief Complaint  Patient presents with   Foot Pain    Patient is here for left foot heel pain that she has had since December 2023.    37 y.o. female presents for concern of left foot pain that has been going on for about a month. Relates she feels tightness in the left heel and first steps after rest are painful and then improve as she continues walking. Long periods of being on her feet hurt as well. She has tried wearing more tennis shoes and crocs. Has not tried any anti-inflammatories. Allergic to ibuprofen . Denies any other pedal complaints. Denies n/v/f/c.   Past Medical History:  Diagnosis Date   Abdominal pain    Anxiety    Bilateral edema of lower extremity    Chlamydia 08/02/2006   Constipation    Lactose intolerance    Lower back pain    Other fatigue    Runner's knee    Shortness of breath on exertion    Vitamin D deficiency     Objective:  Physical Exam: Vascular: DP/PT pulses 2/4 bilateral. CFT <3 seconds. Normal hair growth on digits. No edema.  Skin. No lacerations or abrasions bilateral feet.  Musculoskeletal: MMT 5/5 bilateral lower extremities in DF, PF, Inversion and Eversion. Deceased ROM in DF of ankle joint. Tender to medial calcaneal tubercle on the left. No pain along arch achilles or PT tendon. No pain with calcaneal squeeze.  Neurological: Sensation intact to light touch.   Assessment:   1. Plantar fasciitis, left      Plan:  Patient was evaluated and treated and all questions answered. Discussed plantar fasciitis with patient.  X-rays reviewed and discussed with patient. No acute fractures or dislocations noted. Mild spurring noted at inferior calcaneus.  Discussed treatment options including, ice, NSAIDS, supportive shoes, bracing, and stretching. Stretching exercises provided to be done on a daily basis.   Prescription for  medrol dose pack provided and  sent to pharmacy. PF brace dispensed.  Follow-up 6 weeks or sooner if any problems arise. In the meantime, encouraged to call the office with any questions, concerns, change in symptoms.   instructions for aftercare.     Lorenda Peck, DPM

## 2022-08-24 NOTE — Patient Instructions (Signed)

## 2022-08-25 ENCOUNTER — Telehealth: Payer: Self-pay | Admitting: *Deleted

## 2022-08-25 NOTE — Telephone Encounter (Signed)
Patient is calling to ask if she can drink wine or alcohol with the prednisone prescribed?  Is she restricted from doing any other exercises such as weight lifting or tread mill?  Please advise.

## 2022-08-26 NOTE — Telephone Encounter (Signed)
It is safe to have a glass or two while on it but may increase stomach upset and should avoid excessive alcohol. And she does not have any restrictions when it comes to exercise. Thanks

## 2022-10-05 ENCOUNTER — Ambulatory Visit (INDEPENDENT_AMBULATORY_CARE_PROVIDER_SITE_OTHER): Payer: BC Managed Care – PPO | Admitting: Podiatry

## 2022-10-05 DIAGNOSIS — Z91199 Patient's noncompliance with other medical treatment and regimen due to unspecified reason: Secondary | ICD-10-CM

## 2022-10-05 NOTE — Progress Notes (Signed)
No show

## 2022-10-07 ENCOUNTER — Ambulatory Visit: Payer: BC Managed Care – PPO | Admitting: Podiatry

## 2022-10-07 ENCOUNTER — Encounter: Payer: Self-pay | Admitting: Podiatry

## 2022-10-07 DIAGNOSIS — M722 Plantar fascial fibromatosis: Secondary | ICD-10-CM

## 2022-10-07 MED ORDER — DEXAMETHASONE SODIUM PHOSPHATE 120 MG/30ML IJ SOLN
4.0000 mg | Freq: Once | INTRAMUSCULAR | Status: AC
  Administered 2022-10-07: 4 mg via INTRA_ARTICULAR

## 2022-10-07 NOTE — Progress Notes (Signed)
  Subjective:  Patient ID: Regina Bowers, female    DOB: 1986/07/04,   MRN: [id number]  Chief Complaint  Patient presents with   Plantar Fasciitis    Patient states left foot pain is better since the last visit. Sharp pain still in her heel mainly in the mornings. Patient states PF brace is helpful    37 y.o. female presents for follow-up of left plantar fasciitis. Relates it is doing better but only about 50%. Relates she works a second job and the last week or two the pain has been worsening. Relates it is improved though. She has been stretching but not confident in her stretches. Has been wearing the brace and feels like the medrol dose pack helped.  Denies any other pedal complaints. Denies n/v/f/c.   Past Medical History:  Diagnosis Date   Abdominal pain    Anxiety    Bilateral edema of lower extremity    Chlamydia 08/02/2006   Constipation    Lactose intolerance    Lower back pain    Other fatigue    Runner's knee    Shortness of breath on exertion    Vitamin D deficiency     Objective:  Physical Exam: Vascular: DP/PT pulses 2/4 bilateral. CFT <3 seconds. Normal hair growth on digits. No edema.  Skin. No lacerations or abrasions bilateral feet.  Musculoskeletal: MMT 5/5 bilateral lower extremities in DF, PF, Inversion and Eversion. Deceased ROM in DF of ankle joint. Tender to medial calcaneal tubercle on the left. No pain along arch achilles or PT tendon. No pain with calcaneal squeeze.  Neurological: Sensation intact to light touch.   Assessment:   1. Plantar fasciitis, left       Plan:  Patient was evaluated and treated and all questions answered. Discussed plantar fasciitis with patient.  X-rays reviewed and discussed with patient. No acute fractures or dislocations noted. Mild spurring noted at inferior calcaneus.  Discussed treatment options including, ice, NSAIDS, supportive shoes, bracing, and stretching.  Continue stretching and using brace.  Referral to  PT placed.  Injection offered today and patient in agreement. Procedure below.  Discussed inserts OTC vs custom she will look into insurance coverage for custom. If covered she will call to get fitted if not will consider power steps.  Follow-up 6 weeks or sooner if any problems arise. In the meantime, encouraged to call the office with any questions, concerns, change in symptoms.   instructions for aftercare.    Procedure: Injection Tendon/Ligament Discussed alternatives, risks, complications and verbal consent was obtained.  Location: Left plantar fascia. Skin Prep: Alcohol. Injectate: 1cc 0.5% marcaine plain, 1 cc dexamethasone.  Disposition: Patient tolerated procedure well. Injection site dressed with a band-aid.  Post-injection care was discussed and return precautions discussed.     Lorenda Peck, DPM

## 2022-12-02 ENCOUNTER — Ambulatory Visit (INDEPENDENT_AMBULATORY_CARE_PROVIDER_SITE_OTHER): Payer: BC Managed Care – PPO

## 2022-12-02 ENCOUNTER — Ambulatory Visit: Payer: BC Managed Care – PPO | Admitting: Podiatry

## 2022-12-02 ENCOUNTER — Encounter: Payer: Self-pay | Admitting: Podiatry

## 2022-12-02 DIAGNOSIS — G8929 Other chronic pain: Secondary | ICD-10-CM

## 2022-12-02 DIAGNOSIS — M79671 Pain in right foot: Secondary | ICD-10-CM

## 2022-12-02 DIAGNOSIS — M722 Plantar fascial fibromatosis: Secondary | ICD-10-CM

## 2022-12-02 NOTE — Progress Notes (Signed)
  Subjective:  Patient ID: Regina Bowers, female    DOB: [birth year],   MRN: [id number]  Chief Complaint  Patient presents with   Plantar Fasciitis    Patient came in today for left foot plantar fasciitis pain, patient was doing better but went out for her birthday, rate of pain 8 out of 10, injections have seemed to help     37 y.o. female presents for follow-up of left plantar fasciitis. Relates it is doing better was doing really well but then went on night out and pain worsened but has been improving since mostly with good shoes. Never did start PT and now having some pain starting in right foot.  Denies any other pedal complaints. Denies n/v/f/c.   Past Medical History:  Diagnosis Date   Abdominal pain    Anxiety    Bilateral edema of lower extremity    Chlamydia 08/02/2006   Constipation    Lactose intolerance    Lower back pain    Other fatigue    Runner's knee    Shortness of breath on exertion    Vitamin D deficiency     Objective:  Physical Exam: Vascular: DP/PT pulses 2/4 bilateral. CFT <3 seconds. Normal hair growth on digits. No edema.  Skin. No lacerations or abrasions bilateral feet.  Musculoskeletal: MMT 5/5 bilateral lower extremities in DF, PF, Inversion and Eversion. Deceased ROM in DF of ankle joint. Tender to medial calcaneal tubercle on the left. No pain along arch achilles or PT tendon. No pain with calcaneal squeeze.  Neurological: Sensation intact to light touch.   Assessment:   1. Plantar fasciitis, left   2. Chronic heel pain, right        Plan:  Patient was evaluated and treated and all questions answered. Discussed plantar fasciitis with patient.  X-rays reviewed and discussed with patient. No acute fractures or dislocations noted. Mild spurring noted at inferior calcaneus.  Reviewed X-ray on right with midl spurring.  Discussed treatment options including, ice, NSAIDS, supportive shoes, bracing, and stretching.  Continue stretching and  using brace.  Referral to PT placed.  Defer injection today.  Discussed inserts OTC vs custom she will look into insurance coverage for custom. If covered she will call to get fitted if not will consider power steps.  Follow-up as needed.    Louann Sjogren, DPM

## 2022-12-07 ENCOUNTER — Ambulatory Visit: Payer: BC Managed Care – PPO | Admitting: Physical Therapy
